# Patient Record
Sex: Male | Born: 1960 | Race: White | Hispanic: No | Marital: Married | Smoking: Never smoker
Health system: Southern US, Community
[De-identification: ages and names within clinical notes are randomized; demographics above are authoritative.]

## PROBLEM LIST (undated history)

## (undated) DIAGNOSIS — Z87442 Personal history of urinary calculi: Secondary | ICD-10-CM

## (undated) DIAGNOSIS — T8859XA Other complications of anesthesia, initial encounter: Secondary | ICD-10-CM

## (undated) DIAGNOSIS — T4145XA Adverse effect of unspecified anesthetic, initial encounter: Secondary | ICD-10-CM

## (undated) DIAGNOSIS — R519 Headache, unspecified: Secondary | ICD-10-CM

## (undated) DIAGNOSIS — R51 Headache: Secondary | ICD-10-CM

## (undated) DIAGNOSIS — C801 Malignant (primary) neoplasm, unspecified: Secondary | ICD-10-CM

## (undated) DIAGNOSIS — I10 Essential (primary) hypertension: Secondary | ICD-10-CM

## (undated) HISTORY — PX: APPENDECTOMY: SHX54

## (undated) HISTORY — PX: CHOLECYSTECTOMY: SHX55

## (undated) HISTORY — PX: SHOULDER ARTHROSCOPY: SHX128

---

## 2004-04-30 DIAGNOSIS — C439 Malignant melanoma of skin, unspecified: Secondary | ICD-10-CM

## 2004-04-30 HISTORY — DX: Malignant melanoma of skin, unspecified: C43.9

## 2010-08-28 DIAGNOSIS — C4491 Basal cell carcinoma of skin, unspecified: Secondary | ICD-10-CM

## 2010-08-28 HISTORY — DX: Basal cell carcinoma of skin, unspecified: C44.91

## 2014-08-05 ENCOUNTER — Ambulatory Visit
Admit: 2014-08-05 | Disposition: A | Discharge status: Home / Self Care | Payer: Self-pay | Attending: Unknown Physician Specialty | Admitting: Unknown Physician Specialty

## 2014-08-23 LAB — SURGICAL PATHOLOGY

## 2018-03-28 ENCOUNTER — Other Ambulatory Visit: Payer: Self-pay

## 2018-03-28 ENCOUNTER — Emergency Department: Payer: No Typology Code available for payment source

## 2018-03-28 ENCOUNTER — Emergency Department
Admission: EM | Admit: 2018-03-28 | Discharge: 2018-03-28 | Disposition: A | Discharge status: Home / Self Care | Payer: No Typology Code available for payment source | Attending: Emergency Medicine | Admitting: Emergency Medicine

## 2018-03-28 DIAGNOSIS — S52042A Displaced fracture of coronoid process of left ulna, initial encounter for closed fracture: Secondary | ICD-10-CM | POA: Diagnosis not present

## 2018-03-28 DIAGNOSIS — S0990XA Unspecified injury of head, initial encounter: Secondary | ICD-10-CM | POA: Diagnosis not present

## 2018-03-28 DIAGNOSIS — S53105A Unspecified dislocation of left ulnohumeral joint, initial encounter: Secondary | ICD-10-CM

## 2018-03-28 DIAGNOSIS — W010XXA Fall on same level from slipping, tripping and stumbling without subsequent striking against object, initial encounter: Secondary | ICD-10-CM | POA: Insufficient documentation

## 2018-03-28 DIAGNOSIS — Y999 Unspecified external cause status: Secondary | ICD-10-CM | POA: Insufficient documentation

## 2018-03-28 DIAGNOSIS — S59902A Unspecified injury of left elbow, initial encounter: Secondary | ICD-10-CM | POA: Diagnosis present

## 2018-03-28 DIAGNOSIS — Y92007 Garden or yard of unspecified non-institutional (private) residence as the place of occurrence of the external cause: Secondary | ICD-10-CM | POA: Insufficient documentation

## 2018-03-28 DIAGNOSIS — Y9301 Activity, walking, marching and hiking: Secondary | ICD-10-CM | POA: Diagnosis not present

## 2018-03-28 DIAGNOSIS — S53005A Unspecified dislocation of left radial head, initial encounter: Secondary | ICD-10-CM | POA: Insufficient documentation

## 2018-03-28 DIAGNOSIS — W19XXXA Unspecified fall, initial encounter: Secondary | ICD-10-CM

## 2018-03-28 MED ORDER — TETANUS-DIPHTH-ACELL PERTUSSIS 5-2.5-18.5 LF-MCG/0.5 IM SUSP: 0.5000 mL | Freq: Once | INTRAMUSCULAR | Status: DC

## 2018-03-28 MED ORDER — OXYCODONE-ACETAMINOPHEN 5-325 MG PO TABS: 1.0000 | ORAL_TABLET | ORAL | 0 refills | Status: DC | PRN

## 2018-03-28 MED ORDER — ONDANSETRON HCL 4 MG/2ML IJ SOLN
INTRAMUSCULAR | Status: AC
  Administered 2018-03-28: 4 mg
  Filled 2018-03-28: qty 2

## 2018-03-28 MED ORDER — FENTANYL CITRATE (PF) 100 MCG/2ML IJ SOLN
INTRAMUSCULAR | Status: AC
  Administered 2018-03-28: 50 ug
  Filled 2018-03-28: qty 2

## 2018-03-28 MED ORDER — LIDOCAINE HCL (PF) 1 % IJ SOLN: 5.0000 mL | Freq: Once | INTRAMUSCULAR | Status: DC

## 2018-03-28 NOTE — ED Triage Notes (Addendum)
Slipped on leaves and fell on packed gravel. Landed on L elbow. Dislocaiton/fracture noted by this RN. Hypotensive and diaphoretic in triage. L hand cool to touch but cap refill less than 3 seconds, pulses present in L wrist.

## 2018-03-28 NOTE — ED Provider Notes (Signed)
[name]  ____________________________________________  Time seen: Approximately 4:03 PM  I have reviewed the triage vital signs and the nursing notes.   HISTORY  Chief Complaint Dislocation   HPI Cory Navarro is a 57 y.o. male no significant past medical history who presents for evaluation of mechanical fall.  Patient reports that he was stepping back in his yard to step on a ladder when he slipped on wet leaves and fell backwards.  He hit the back of his head onto the floor.  He thinks that he had a brief LOC.  Patient fell on top of his left elbow.  Is complaining of severe constant pain since the fall that is located on the left elbow, worse with any movement.  He has an obvious deformity to it.  He denies headache, neck pain, back pain, chest pain, any other extremity pain.   PMH None  Past Surgical History:  Procedure Laterality Date  . APPENDECTOMY    . CHOLECYSTECTOMY    . SHOULDER ARTHROSCOPY      Prior to Admission medications   Medication Sig Start Date End Date Taking? Authorizing Provider  cyclobenzaprine (FLEXERIL) 10 MG tablet Take 10 mg by mouth 3 (three) times daily as needed for muscle spasms.   Yes [provider]  naproxen sodium (ALEVE) 220 MG tablet Take 220 mg by mouth daily as needed.   Yes [provider]  oxyCODONE-acetaminophen (PERCOCET) 5-325 MG tablet Take 1 tablet by mouth every 4 (four) hours as needed for severe pain. 03/28/18   Rudene Re, MD    Allergies Morphine and related  History reviewed. No pertinent family history.  Social History Social History   Tobacco Use  . Smoking status: Never Smoker  Substance Use Topics  . Alcohol use: Yes  . Drug use: Not on file    Review of Systems Constitutional: Negative for fever. Eyes: Negative for visual changes. ENT: Negative for facial injury or neck injury Cardiovascular: Negative for chest  injury. Respiratory: Negative for shortness of breath. Negative for chest wall injury. Gastrointestinal: Negative for abdominal pain or injury. Genitourinary: Negative for dysuria. Musculoskeletal: Negative for back injury, + L elbow deformity Skin: Negative for laceration/abrasions. Neurological: Negative for head injury.   ____________________________________________   PHYSICAL EXAM:  VITAL SIGNS: ED Triage Vitals  Enc Vitals Group     BP 03/28/18 1236 (!) 88/68     Pulse Rate 03/28/18 1236 74     Resp 03/28/18 1236 18     Temp 03/28/18 1236 (!) 97.3 F (36.3 C)     Temp Source 03/28/18 1236 Oral     SpO2 03/28/18 1236 96 %     Weight 03/28/18 1237 212 lb (96.2 kg)     Height 03/28/18 1237 6\' 2"  (1.88 m)     Head Circumference --      Peak Flow --      Pain Score 03/28/18 1237 10     Pain Loc --      Pain Edu? --      Excl. in [place]? --     Constitutional: Alert and oriented. No acute distress. Does not appear intoxicated. HEENT Head: Normocephalic and atraumatic. Face: No facial bony tenderness. Stable midface Ears: No hemotympanum bilaterally. No Battle sign Eyes: No eye injury. PERRL. No raccoon eyes Nose: Nontender. No epistaxis. No rhinorrhea Mouth/Throat: Mucous membranes are moist. No oropharyngeal blood. No dental injury. Airway patent without stridor. Normal voice. Neck: no  C-collar in place. No midline c-spine tenderness.  Cardiovascular: Normal rate, regular rhythm. Normal and symmetric distal pulses are present in all extremities. Pulmonary/Chest: Chest wall is stable and nontender to palpation/compression. Normal respiratory effort. Breath sounds are normal. No crepitus.  Abdominal: Soft, nontender, non distended. Musculoskeletal: Obvious deformity of the left elbow concerning for dislocation with intact neurovascular exam, strong distal pulses and brisk capillary refill.  Nontender with normal full range of motion in all other extremities.No thoracic or  lumbar midline spinal tenderness. Pelvis is stable. Skin: Skin is warm, dry and intact. No abrasions or contutions. Psychiatric: Speech and behavior are appropriate. Neurological: Normal speech and language. Moves all extremities to command. No gross focal neurologic deficits are appreciated.  Glascow Coma Score: 4 - Opens eyes on own 6 - Follows simple motor commands 5 - Alert and oriented GCS: 15  ____________________________________________   LABS (all labs ordered are listed, but only abnormal results are displayed)  Labs Reviewed - No data to display ____________________________________________  EKG  none  ____________________________________________  RADIOLOGY  I have personally reviewed the images performed during this visit and I agree with the Radiologist's read.   Interpretation by Radiologist:  Dg Elbow 2 Views Left  Result Date: 03/28/2018 CLINICAL DATA:  Postreduction of elbow dislocation. EXAM: LEFT ELBOW - 2 VIEW 1:09 p.m. COMPARISON:  03/28/2018 at 12:44 p.m. FINDINGS: The dislocation at the elbow joint has been reduced. The radial head and proximal ulna appear in anatomic position. There is an avulsion fracture of coronoid process of the proximal ulna. Enthesophyte at the distal triceps insertion. Soft tissue calcifications adjacent to the medial epicondyle could be chronic or acute. IMPRESSION: Reduction of the dislocation at the left elbow. Displaced fracture of the coronoid process of the proximal ulna. Electronically Signed   By: [name] M.D.   On: 03/28/2018 13:29   Dg Elbow 2 Views Left  Result Date: 03/28/2018 CLINICAL DATA:  57 year old male with a history of fall and elbow pain EXAM: LEFT ELBOW - 2 VIEW COMPARISON:  None. FINDINGS: Dislocation of the elbow joint. Fracture fragment adjacent to the trochlea on the frontal view likely coronoid process. Associated soft tissue swelling. Degenerative changes. IMPRESSION: Acute fracture dislocation of  the elbow, with the fracture fragment likely representing a displaced coronoid process fracture. Electronically Signed   By: [name] D.O.   On: 03/28/2018 13:00   Ct Head Wo Contrast  Result Date: 03/28/2018 CLINICAL DATA:  Fall with head injury EXAM: CT HEAD WITHOUT CONTRAST CT CERVICAL SPINE WITHOUT CONTRAST TECHNIQUE: Multidetector CT imaging of the head and cervical spine was performed following the standard protocol without intravenous contrast. Multiplanar CT image reconstructions of the cervical spine were also generated. COMPARISON:  None. FINDINGS: CT HEAD FINDINGS Brain: There is no mass effect, midline shift, or acute intracranial hemorrhage. Mild global atrophy. Vascular: No hyperdense vessel or unexpected calcification. Skull: Cranium is intact. Sinuses/Orbits: There is a small depressed fracture of the medial right orbital wall. There is no associated fluid in the ethmoid air cells in this may represent a chronic finding. Paranasal sinuses are otherwise clear. Mastoid air cells are clear. No evidence of orbital or vitreous hemorrhage. Other: Noncontributory. CT CERVICAL SPINE FINDINGS Alignment: Anatomic Skull base and vertebrae: No acute fracture or dislocation. Soft tissues and spinal canal: No obvious spinal hematoma or soft tissue hematoma. No prevertebral edema. There is a calcified mass measuring 1.9 x 1.7 cm posterior to the lower pole of the left lobe of the  thyroid gland. This may represent an exophytic thyroid nodule. Disc levels: No obvious spinal stenosis. Mild scattered degenerative disc disease and degenerative changes in the facet joints. Upper chest: Negative. Other: Noncontributory. IMPRESSION: No acute intracranial pathology.  Atrophy is noted. No evidence of acute cervical spine injury. 1.9 cm partially calcified nonspecific mass posterior to the lower pole of the left lobe of the thyroid gland. This may represent an exophytic thyroid nodule. Thyroid ultrasound is  recommended. If it is separate from the thyroid gland, malignancy is not excluded. Electronically Signed   By: Marybelle Killings M.D.   On: 03/28/2018 13:46   Ct Cervical Spine Wo Contrast  Result Date: 03/28/2018 CLINICAL DATA:  Fall with head injury EXAM: CT HEAD WITHOUT CONTRAST CT CERVICAL SPINE WITHOUT CONTRAST TECHNIQUE: Multidetector CT imaging of the head and cervical spine was performed following the standard protocol without intravenous contrast. Multiplanar CT image reconstructions of the cervical spine were also generated. COMPARISON:  None. FINDINGS: CT HEAD FINDINGS Brain: There is no mass effect, midline shift, or acute intracranial hemorrhage. Mild global atrophy. Vascular: No hyperdense vessel or unexpected calcification. Skull: Cranium is intact. Sinuses/Orbits: There is a small depressed fracture of the medial right orbital wall. There is no associated fluid in the ethmoid air cells in this may represent a chronic finding. Paranasal sinuses are otherwise clear. Mastoid air cells are clear. No evidence of orbital or vitreous hemorrhage. Other: Noncontributory. CT CERVICAL SPINE FINDINGS Alignment: Anatomic Skull base and vertebrae: No acute fracture or dislocation. Soft tissues and spinal canal: No obvious spinal hematoma or soft tissue hematoma. No prevertebral edema. There is a calcified mass measuring 1.9 x 1.7 cm posterior to the lower pole of the left lobe of the thyroid gland. This may represent an exophytic thyroid nodule. Disc levels: No obvious spinal stenosis. Mild scattered degenerative disc disease and degenerative changes in the facet joints. Upper chest: Negative. Other: Noncontributory. IMPRESSION: No acute intracranial pathology.  Atrophy is noted. No evidence of acute cervical spine injury. 1.9 cm partially calcified nonspecific mass posterior to the lower pole of the left lobe of the thyroid gland. This may represent an exophytic thyroid nodule. Thyroid ultrasound is recommended.  If it is separate from the thyroid gland, malignancy is not excluded. Electronically Signed   By: Marybelle Killings M.D.   On: 03/28/2018 13:46   Ct Elbow Left Wo Contrast  Result Date: 03/28/2018 CLINICAL DATA:  Fracture dislocation at the left elbow, now reduced. EXAM: CT OF THE UPPER LEFT EXTREMITY WITHOUT CONTRAST TECHNIQUE: Multidetector CT imaging of the upper left extremity was performed according to the standard protocol. COMPARISON:  Radiographs dated 03/28/2018 FINDINGS: Bones/Joint/Cartilage The dislocations of the radial head and of the proximal ulna had been completely reduced. There is a comminuted fracture of the tip of the coronoid process of the ulna. The fragments are slightly displaced but none are trapped within the joint at this time. Posterior olecranon enthesophyte at the triceps insertion, chronic. Distal humerus is normal. IMPRESSION: 1. Complete reduction of the dislocations of the radial head and proximal ulna. 2. Comminuted fracture of the coronoid process of the ulna with no trapped fragments at this time. Electronically Signed   By: [name] M.D.   On: 03/28/2018 14:40     ____________________________________________   PROCEDURES  Procedure(s) performed: yes Procedures   Reduction of dislocation Date/Time: 4:16 PM Performed by: [place] by: Rudene Re Consent: Verbal consent obtained. Risks and benefits: risks, benefits and alternatives were discussed  Consent given by: patient Required items: required blood products, implants, devices, and special equipment available Time out: Immediately prior to procedure a "time out" was called to verify the correct patient, procedure, equipment, support staff and site/side marked as required.  Patient sedated: no  Vitals: Vital signs were monitored during sedation. Patient tolerance: Patient tolerated the procedure well with no immediate complications. Joint: L elbow Reduction technique:  traction   Critical Care performed:  None ____________________________________________   INITIAL IMPRESSION / ASSESSMENT AND PLAN / ED COURSE  57 y.o. male no significant past medical history who presents for evaluation of mechanical fall.  Patient with a left elbow dislocation which was reduced per procedure note above with no complications.  Patient found to have a proximal ulna fracture which was discussed with Dr. pathology from orthopedics.  He recommended a CT of the elbow which was performed in the emergency room.  After evaluating the CT he recommends follow-up in a tertiary center for surgical repair within the next few days. He also recommended placement of a posterior slab which was done in the emergency room.  Limb was neurovascularly intact pre-and post reduction.  No signs of compartment syndrome.  Pain is extremely well controlled at this time.  Patient also underwent CT head and cervical spine which were unremarkable.  I spoke with Dr. Madie Reno from [facility] who says patient should be seen first thing on Monday for surgical repair as soon as possible.  I discussed that with the patient and I provided him with the phone number and address of [facility].  Discussed signs and symptoms of compartment syndrome with the patient and recommended immediate return to the emergency room if these develop.  At this time patient is stable for discharge home.      As part of my medical decision making, I reviewed the following data within the [place] notes reviewed and incorporated, Labs reviewed , Old chart reviewed, Radiograph reviewed , A consult was requested and obtained from this/these consultant(s) Orthopedics, Notes from prior ED visits and [place] Controlled Substance Database    Pertinent labs & imaging results that were available during my care of the patient were reviewed by me and considered in my medical decision making (see chart for  details).    ____________________________________________   FINAL CLINICAL IMPRESSION(S) / ED DIAGNOSES  Final diagnoses:  Fall, initial encounter  Dislocation of left elbow, initial encounter  Traumatic injury of head, initial encounter  Closed displaced fracture of coronoid process of left ulna, initial encounter      NEW MEDICATIONS STARTED DURING THIS VISIT:  ED Discharge Orders         Ordered    oxyCODONE-acetaminophen (PERCOCET) 5-325 MG tablet  Every 4 hours PRN     03/28/18 1614           Note:  This document was prepared using Dragon voice recognition software and may include unintentional dictation errors.    Rudene Re, MD 03/28/18 [phone]

## 2018-03-28 NOTE — ED Notes (Signed)
Pt and wife are frustrated that the surgeon has not come to see them. They have questions and pt is thirsty. Pt also asking that the fiberglass be covered on the splint to avoid discomfort to his hand. Pt given something to drink and questions answered. Ortho on call is not comfortable with the fx so Dr Alfred Levins is reaching out to [facility].

## 2018-03-28 NOTE — Discharge Instructions (Addendum)
Per recommendation of Dr. Madie Reno of [facility] you should be seen in their clinic on Monday for immediate surgical repair.  Make sure to call the office then for an appointment.  Return to the emergency room if you have worsening pain, tingling or numbness of your arm, changes of color of your fingers as these may be sign of a more severe condition.  Otherwise take pain medication, apply ice, elevate lab, and maintain on splint until cleared by orthopedics.

## 2018-03-30 HISTORY — PX: ELBOW SURGERY: SHX618

## 2018-04-18 ENCOUNTER — Other Ambulatory Visit: Payer: Self-pay | Admitting: Otolaryngology

## 2018-04-18 DIAGNOSIS — E041 Nontoxic single thyroid nodule: Secondary | ICD-10-CM

## 2018-04-22 ENCOUNTER — Ambulatory Visit
Admission: RE | Admit: 2018-04-22 | Discharge: 2018-04-22 | Disposition: A | Discharge status: Home / Self Care | Payer: No Typology Code available for payment source | Source: Ambulatory Visit | Attending: Otolaryngology | Admitting: Otolaryngology

## 2018-04-22 DIAGNOSIS — E041 Nontoxic single thyroid nodule: Secondary | ICD-10-CM

## 2018-04-22 DIAGNOSIS — E042 Nontoxic multinodular goiter: Secondary | ICD-10-CM | POA: Insufficient documentation

## 2018-04-24 ENCOUNTER — Other Ambulatory Visit: Payer: Self-pay | Admitting: Otolaryngology

## 2018-04-24 DIAGNOSIS — E041 Nontoxic single thyroid nodule: Secondary | ICD-10-CM

## 2018-05-01 ENCOUNTER — Ambulatory Visit
Admission: RE | Admit: 2018-05-01 | Discharge: 2018-05-01 | Disposition: A | Discharge status: Home / Self Care | Payer: No Typology Code available for payment source | Source: Ambulatory Visit | Attending: Otolaryngology | Admitting: Otolaryngology

## 2018-05-01 DIAGNOSIS — E041 Nontoxic single thyroid nodule: Secondary | ICD-10-CM | POA: Diagnosis not present

## 2018-05-01 NOTE — Procedures (Signed)
Pre Procedure Dx: Indeterminate left sided thyroid nodule Post Procedural Dx: Same  Technically successful US guided biopsy of indeterminate nodule within the left lobe of the thyroid.   EBL: None  No immediate complications.   Ronny Bacon, MD Pager #: [phone]

## 2018-05-01 NOTE — Discharge Instructions (Signed)
Thyroid Needle Biopsy, Care After  This sheet gives you information about how to care for yourself after your procedure. Your health care provider may also give you more specific instructions. If you have problems or questions, contact your health care provider.  What can I expect after the procedure?  After the procedure, it is common to have:   Soreness and tenderness that lasts for a few days.   Bruising where the needle was inserted (puncture site).  Follow these instructions at home:     Take over-the-counter and prescription medicines only as told by your health care provider.   To help ease discomfort, keep your head raised (elevated) when you are lying down. When you move from lying down to sitting up, use both hands to support the back of your head and neck.   Check your puncture site every day for signs of infection. Check for:  ? Redness, swelling, or pain.  ? Fluid or blood.  ? Warmth.  ? Pus or a bad smell.   Return to your normal activities as told by your health care provider. Ask your health care provider what activities are safe for you.   Keep all follow-up visits as told by your health care provider. This is important.  Contact a health care provider if:   You have redness, swelling, or pain around your puncture site.   You have fluid or blood coming from your puncture site.   Your puncture site feels warm to the touch.   You have pus or a bad smell coming from your puncture site.   You have a fever.  Get help right away if:   You have severe bleeding from the puncture site.   You have difficulty swallowing.   You have swollen glands (lymph nodes) in your neck.  Summary   It is common to have some bruising and soreness where the needle was inserted in your lower front neck area (puncture site).   Check your puncture site every day for signs of infection, such as redness, swelling, or pain.   Get help right away if you have severe bleeding from your puncture site.  This  information is not intended to replace advice given to you by your health care provider. Make sure you discuss any questions you have with your health care provider.  Document Released: 11/11/2013 Document Revised: 01/28/2017 Document Reviewed: 01/28/2017  Elsevier Interactive Patient Education  2019 [employer]

## 2018-05-05 ENCOUNTER — Other Ambulatory Visit: Payer: Self-pay | Admitting: Anatomic Pathology & Clinical Pathology

## 2018-05-05 LAB — CYTOLOGY - NON PAP

## 2018-05-08 ENCOUNTER — Inpatient Hospital Stay: Payer: No Typology Code available for payment source | Attending: Anatomic Pathology & Clinical Pathology

## 2018-05-08 NOTE — Progress Notes (Signed)
Tumor Board Documentation  Rito Lecomte was presented by Dr Pryor Ochoa at our Tumor Board on 05/08/2018, which included representatives from medical oncology, radiation oncology, surgical oncology, internal medicine, navigation, pathology, radiology, surgical, research, palliative care, pulmonology.  Cory Navarro currently presents for discussion, for new positive pathology with history of the following treatments: surgical intervention(s).  Additionally, we reviewed previous medical and familial history, history of present illness, and recent lab results along with all available histopathologic and imaging studies. The tumor board considered available treatment options and made the following recommendations: Surgery Pt scheduled for Total Thyroidectomy 05/15/18  The following procedures/referrals were also placed: No orders of the defined types were placed in this encounter.   Clinical Trial Status: not discussed   Staging used: AJCC Stage Group  National site-specific guidelines NCCN were discussed with respect to the case.  Tumor board is a meeting of clinicians from various specialty areas who evaluate and discuss patients for whom a multidisciplinary approach is being considered. Final determinations in the plan of care are those of the provider(s). The responsibility for follow up of recommendations given during tumor board is that of the provider.   Today's extended care, comprehensive team conference, [place] was not present for the discussion and was not examined.   Multidisciplinary Tumor Board is a multidisciplinary case peer review process.  Decisions discussed in the Multidisciplinary Tumor Board reflect the opinions of the specialists present at the conference without having examined the patient.  Ultimately, treatment and diagnostic decisions rest with the primary provider(s) and the patient.

## 2018-05-12 ENCOUNTER — Other Ambulatory Visit: Payer: Self-pay

## 2018-05-12 ENCOUNTER — Encounter
Admission: RE | Admit: 2018-05-12 | Discharge: 2018-05-12 | Disposition: A | Discharge status: Home / Self Care | Payer: No Typology Code available for payment source | Source: Ambulatory Visit | Attending: Otolaryngology | Admitting: Otolaryngology

## 2018-05-12 HISTORY — DX: Adverse effect of unspecified anesthetic, initial encounter: T41.45XA

## 2018-05-12 HISTORY — DX: Headache: R51

## 2018-05-12 HISTORY — DX: Personal history of urinary calculi: Z87.442

## 2018-05-12 HISTORY — DX: Headache, unspecified: R51.9

## 2018-05-12 HISTORY — DX: Essential (primary) hypertension: I10

## 2018-05-12 HISTORY — DX: Other complications of anesthesia, initial encounter: T88.59XA

## 2018-05-12 HISTORY — DX: Malignant (primary) neoplasm, unspecified: C80.1

## 2018-05-12 NOTE — Pre-Procedure Instructions (Signed)
Progress Notes - documented in this encounter Lendon Colonel, PA - 05/12/2018 10:30 AM EST Formatting of this note might be different from the original. Chief Complaint  Patient presents with  . Blood Pressure Check  elevated for several weeks. recently dx'd w/thyroid cancer.   Cory Navarro is a 58 y.o. male who is here for an acute visit.  He is here for concern of elevated blood pressure that has been present for the past several weeks. He had elbow surgery in December 2019 and since then his blood pressure has been elevated. He would check his readings at home and it would ranges 140s/80-90s on average. He has had intermittently elevated blood pressure over the past several years, but has not been on medication for blood pressure. He has been advised to focus on reducing his cholesterol through diet - he does not eat fried foods. He has been on atorvastatin 10 mg in the past and discontinued this medication because [place] ([place] of the country for the past 18 months). He had elbow surgery in December 2019 and has not been able to exercise regularly. He denies daily headaches, blurred vision, dizziness, lightheadedness, chest pain, or shortness of breath.   He also was recently diagnosed with papillary thyroid carcinoma and is scheduled for a total thyroidectomy on 05/15/2018. He has been under more stress since his diagnoses, in addition to work stress.   Patient Active Problem List  Diagnosis  . Migraine without aura or status migrainosus  . Nephrolithiasis  . Closed nondisplaced fracture of triquetral bone of wrist  . Closed dislocation of left elbow  . Traumatic closed displaced fracture of coronoid process of ulna, left, initial encounter  . Sprain of ulnar collateral ligament of left elbow   Past Medical History:  Diagnosis Date  . Anesthesia complication  seems to take more to "knock me out"  . H/O tension headache  occasional,  in neck and upper back  . History of cancer  melanoma twice, chemo twice  . Migraines  . Nephrolithiasis   Past Surgical History:  Procedure Laterality Date  . APPENDECTOMY 1972  . CHOLECYSTECTOMY 2001  . COLONOSCOPY 08/05/2014  Hyperplastic Polyp: CBF 07/2024  . LEFT SHOULDER SURGERY  . OPEN REDUCTION OLECRANON PROCESS ELBOW Left 04/03/2018  Procedure: OPEN TREATMENT OF ULNAR FRACTURE, PROXIMAL END (EG, OLECRANON OR CORONOID PROCESS[ES]), INCLUDES INTERNAL FIXATION, WHEN PERFORMED; Surgeon: Dorna Bloom Merwyn Katos, MD; Location: [facility] OR; Service: Orthopedics; Laterality: Left;  . REPAIR LATERAL COLLATERAL LIGAMENT ELBOW W/LOCAL TISSUE Left 04/03/2018  Procedure: REPAIR LATERAL COLLATERAL LIGAMENT, ELBOW, WITH LOCAL TISSUE; Surgeon: Darnelle Bos, MD; Location: [facility] OR; Service: Orthopedics; Laterality: Left;   Family History  Problem Relation Age of Onset  . Breast cancer Mother  . Migraines Father  . Cancer Father  Bladder  . Migraines Other  Grandparents, Aunts and Uncles  . Lymphoma Other  family hx   Social History   Socioeconomic History  . Marital status: Married  Spouse name: Not on file  . Number of children: Not on file  . Years of education: Not on file  . Highest education level: Not on file  Occupational History  . Not on file  Social Needs  . Financial resource strain: Not on file  . Food insecurity:  Worry: Not on file  Inability: Not on file  . Transportation needs:  Medical: Not on file  Non-medical: Not on file  Tobacco Use  . Smoking  status: Never Smoker  . Smokeless tobacco: Never Used  Substance and Sexual Activity  . Alcohol use: No  Alcohol/week: 0.0 standard drinks  . Drug use: No  . Sexual activity: Not on file  Lifestyle  . Physical activity:  Days per week: Not on file  Minutes per session: Not on file  . Stress: Not on file  Relationships  . Social connections:  Talks on phone: Not on file  Gets together: Not on  file  Attends religious service: Not on file  Active member of club or organization: Not on file  Attends meetings of clubs or organizations: Not on file  Relationship status: Not on file  Other Topics Concern  . Not on file  Social History Narrative  . Not on file   Current Outpatient Medications Ordered in Epic  Medication Sig Dispense Refill  . acetaminophen (TYLENOL) 325 MG tablet Take by mouth   No current Epic-ordered facility-administered medications on file.   Allergies  Allergen Reactions  . Morphine Other (See Comments)  Seizures  . Vistaril [Hydroxyzine Hcl] Hives   Results for orders placed or performed in visit on 04/25/18  Urinalysis w/Microscopic  Result Value Ref Range  Color Yellow Yellow  Clarity Clear Clear  Specific Gravity 1.020 1.000 - 1.030  pH, Urine 6.0 5.0 - 8.0  Protein, Urinalysis Negative Negative, Trace mg/dL  Glucose, Urinalysis Negative Negative mg/dL  Ketones, Urinalysis Trace (!) Negative mg/dL  Blood, Urinalysis Negative Negative  Nitrite, Urinalysis Negative Negative  Leukocyte Esterase, Urinalysis Negative Negative  White Blood Cells, Urinalysis None Seen None Seen, 0-3 /hpf  Red Blood Cells, Urinalysis None Seen None Seen, 0-3 /hpf  Bacteria, Urinalysis None Seen None Seen /hpf  Squamous Epithelial Cells, Urinalysis None Seen Rare, Few, None Seen /hpf  CBC w/auto Differential (3 Part)  Result Value Ref Range  WBC (White Blood Cell Count) 7.7 4.1 - 10.2 10^3/uL  RBC (Red Blood Cell Count) 5.51 4.69 - 6.13 10^6/uL  Hemoglobin 15.8 14.1 - 18.1 gm/dL  Hematocrit 49.0 40.0 - 52.0 %  MCV (Mean Corpuscular Volume) 88.9 80.0 - 100.0 fl  MCH (Mean Corpuscular Hemoglobin) 28.7 27.0 - 31.2 pg  MCHC (Mean Corpuscular Hemoglobin Concentration) 32.2 32.0 - 36.0 gm/dL  Platelet Count 465 (H) 150 - 450 10^3/uL  RDW-CV (Red Cell Distribution Width) 12.2 11.6 - 14.8 %  MPV (Mean Platelet Volume) 8.7 (L) 9.4 - 12.4 fl  Neutrophils 5.70 1.50 - 7.80  10^3/uL  Lymphocytes 1.20 1.00 - 3.60 10^3/uL  Mixed Count 0.80 0.10 - 0.90 10^3/uL  Neutrophil % 73.7 (H) 32.0 - 70.0 %  Lymphocyte % 15.6 10.0 - 50.0 %  Mixed % 10.7 3.0 - 14.4 %  Comprehensive Metabolic Panel (CMP)  Result Value Ref Range  Glucose 95 70 - 110 mg/dL  Sodium 140 136 - 145 mmol/L  Potassium 4.8 3.6 - 5.1 mmol/L  Chloride 101 97 - 109 mmol/L  Carbon Dioxide (CO2) 31.3 22.0 - 32.0 mmol/L  Urea Nitrogen (BUN) 12 7 - 25 mg/dL  Creatinine 0.8 0.7 - 1.3 mg/dL  Glomerular Filtration Rate (eGFR), MDRD Estimate 100 >60 mL/min/1.73sq m  Calcium 10.4 (H) 8.7 - 10.3 mg/dL  AST 24 8 - 39 U/L  ALT 38 6 - 57 U/L  Alk Phos (alkaline Phosphatase) 64 34 - 104 U/L  Albumin 4.4 3.5 - 4.8 g/dL  Bilirubin, Total 0.6 0.3 - 1.2 mg/dL  Protein, Total 7.2 6.1 - 7.9 g/dL  A/G Ratio 1.6 1.0 - 5.0 gm/dL  BP Readings from Last 3 Encounters:  05/12/18 (!) 160/104  04/25/18 132/86  04/03/18 (!) 144/95   Wt Readings from Last 3 Encounters:  05/12/18 94.8 kg (209 lb)  04/25/18 90.7 kg (200 lb)  04/18/18 99.8 kg (220 lb 0.3 oz)   Body mass index is 27.57 kg/m.  Review of Systems (as otherwise noted in HPI):  Negative for: fever, chills, nausea, or vomiting. Positive for: elevated blood pressure  Physical Exam:  Vitals:  05/12/18 1029  BP: (!) 160/104  BP Location: Right upper arm  Patient Position: Sitting  BP Cuff Size: Adult  Pulse: 74  SpO2: 97%  Weight: 94.8 kg (209 lb)  Height: 185.4 cm (_0 )   General: Patient is well-groomed, well-nourished, appears stated age, in no acute distress.  HEENT: Head - atraumatic and normocephalic. Eyes - conjunctiva are clear bilaterally without injection, PERRL.   CV: Regular rhythm and normal heart rate, no murmur, no JVD.  Respiratory: Clear to auscultation throughout bilateral lung fields with no wheezing, crackles, or rhonchi. No increased work of breathing.  Assessment/Plan:  1. Essential hypertension 2. Papillary thyroid  carcinoma (CMS-HCC) 3. Anxiety We discussed in length contributing factors to hypertension, including pain and stress. He was recently diagnosed with papillary thyroid cancer and is undergoing a thyroidectomy in 3 days. He has had elevated blood pressure readings over the past several months that has been documented. Discussed that we can treat his blood pressure, but his stress and pain would also need to be controlled. He would like to proceed with medication. He is asymptomatic. Will start him on Losartan 25 mg once daily - may need to increase to 50 mg daily, however his blood pressure from 2 weeks ago was only mildly elevated in 130s/80s in the office. He will follow-up in 4 weeks for recheck. - Comprehensive Metabolic Panel (CMP) - CBC w/auto Differential (3 Part) - losartan (COZAAR) 25 MG tablet; Take 1 tablet (25 mg total) by mouth once daily Dispense: 30 tablet; Refill: 1  4. Hyperlipidemia, mixed Will recheck fasting lipid panel today, and if elevated will start on medication.  - Lipid Panel w/calc LDL  Follow-up in 4 weeks for recheck.   Wayland Denis, PA-C  Electronically signed by Lendon Colonel, PA at 05/12/2018 12:21 PM EST   Plan of Treatment - documented as of this encounter Upcoming Encounters Upcoming Encounters  Date Type Specialty Care Team Description  05/20/2018 Post Op Orthopaedics [place], Merwyn Katos, [place]  [address]  [place], [place] [address]  [phone]  [phone] (Fax)    06/10/2018 Office Visit Internal Medicine Bald[facility] Lenis, Jacqulyn Liner, MD  [address]   [facility]  [place], [place] [address]  [phone]  [phone] (Fax)     Pending Results Pending Results  Name Type Priority Associated Diagnoses Date/Time  Comprehensive Metabolic Panel (CMP) Lab Routine Essential hypertension  05/12/2018 10:51 AM EST  Lipid Panel w/calc LDL Lab Routine  Hyperlipidemia, mixed  05/12/2018 10:51 AM EST   Procedures - documented in this encounter Procedure Name Priority Date/Time Associated Diagnosis Comments  CBC W/AUTO DIFFERENTIAL (3 PART DIFF) - [facility] Routine 05/12/2018 10:51 AM EST Essential hypertension  Results for this procedure are in the results section.    Lab Results - documented in this encounter  CBC w/auto Differential (3 Part) (05/12/2018 10:51 AM EST) CBC w/auto Differential (3 Part) (05/12/2018 10:51 AM EST)  Component Value Ref Range Performed  At Pathologist Signature  [facility] Blood Cell Count) 6.3 4.1 - 10.2 10^3/uL [facility]   RBC (Red Blood Cell Count) 5.13 4.69 - 6.13 10^6/uL [facility]   Hemoglobin 14.6 14.1 - 18.1 gm/dL [facility]   Hematocrit 46.0 40.0 - 52.0 % [facility]   MCV (Mean Corpuscular Volume) 89.7 80.0 - 100.0 fl [facility]   MCH (Mean Corpuscular Hemoglobin) 28.5 27.0 - 31.2 pg [facility]   MCHC (Mean Corpuscular Hemoglobin Concentration) 31.7 (L) 32.0 - 36.0 gm/dL [facility]   Platelet Count 317 150 - 450 10^3/uL [facility]   RDW-CV (Red Cell Distribution Width) 12.3 11.6 - 14.8 % [facility]   MPV (Mean Platelet Volume) 8.7 (L) 9.4 - 12.4 fl [facility]   Neutrophils 3.80 1.50 - 7.80 10^3/uL [facility]   Lymphocytes 1.60 1.00 - 3.60 10^3/uL [facility]   Mixed Count 0.90 0.10 - 0.90 10^3/uL [facility]   Neutrophil % 60.1 32.0 - 70.0 % [facility]   Lymphocyte % 25.2 10.0 - 50.0 % [facility]   Mixed % 14.7 (H) 3.0 - 14.4 % [facility]    CBC w/auto Differential (3 Part) (05/12/2018 10:51 AM EST)  Specimen  Blood   CBC w/auto Differential (3 Part) (05/12/2018 10:51 AM EST)  Performing Organization Address  City/State/Zipcode Phone Number  [facility]  [address]  [place], [place] [address]     Visit Diagnoses - documented in this encounter Diagnosis  Essential hypertension - Primary   Papillary thyroid carcinoma (CMS-HCC)   Anxiety  Anxiety state, unspecified   Hyperlipidemia, mixed  Mixed hyperlipidemia    Discontinued Medications - documented as of this encounter Medication Sig Discontinue Reason Start Date End Date  cyclobenzaprine (FLEXERIL) 10 MG tablet  Take by mouth Therapy completed  05/12/2018  MULTIVITAMIN ORAL  Take by mouth once daily. Therapy completed  05/12/2018   Historical Medications - added in this encounter This list may reflect changes made after this encounter.  Medication Sig Dispensed Refills Start Date End Date  acetaminophen (TYLENOL) 325 MG tablet  Take by mouth  0    Images  Patient Contacts  Contact Name Contact Address Communication Relationship to Patient  Alexande Sheerin [address] [place], [place] [phone] [facility]) [phone] (Home) Spouse, Emergency Contact  Document Information  Primary Care Provider Other Service Providers Document Coverage Dates  Barnabas Lister, MD (Jan. 19, 2016January 19, 2016 - Present) DM: [phone] (Work) [phone] (Fax) [address] and Internal Medicine [place], [place] [address] Family Medicine [facility] [address] [place], [place] [address]  Jan. 13, 2020January 13, 2020   [place] [address] [place], [place] [address]   Encounter Providers Encounter Date  Lendon Colonel, [place] (Attending) DM: [phone] (Work) [phone] (Fax) [place],  [address] Family Medicine Jan. 13, 2020January 13, 2020

## 2018-05-12 NOTE — Patient Instructions (Signed)
Your procedure is scheduled on: 05-15-18 THURSDAY Report to Same Day Surgery 2nd floor medical mall [facility] Entrance-take elevator on left to 2nd floor.  Check in with surgery information desk.) To find out your arrival time please call [phone] between 1PM - 3PM on 05-14-18 [facility]  Remember: Instructions that are not followed completely may result in serious medical risk, up to and including death, or upon the discretion of your surgeon and anesthesiologist your surgery may need to be rescheduled.    _x___ 1. Do not eat food after midnight the night before your procedure. NO GUM OR CANDY AFTER MIDNIGHT.  You may drink clear liquids up to 2 hours before you are scheduled to arrive at the hospital for your procedure.  Do not drink clear liquids within 2 hours of your scheduled arrival to the hospital.  Clear liquids include  --Water or Apple juice without pulp  --Clear carbohydrate beverage such as ClearFast or Gatorade  --Black Coffee or Clear Tea (No milk, no creamers, do not add anything to the coffee or Tea   ____Ensure clear carbohydrate drink on the way to the hospital for bariatric patients  ____Ensure clear carbohydrate drink 3 hours before surgery for Dr Dwyane Luo patients if physician instructed.    __x__ 2. No Alcohol for 24 hours before or after surgery.   __x__3. No Smoking or e-cigarettes for 24 prior to surgery.  Do not use any chewable tobacco products for at least 6 hour prior to surgery   ____  4. Bring all medications with you on the day of surgery if instructed.    __x__ 5. Notify your doctor if there is any change in your medical condition     (cold, fever, infections).    x___6. On the morning of surgery brush your teeth with toothpaste and water.  You may rinse your mouth with mouth wash if you wish.  Do not swallow any toothpaste or mouthwash.   Do not wear jewelry, make-up, hairpins, clips or nail polish.  Do not wear lotions, powders, or perfumes.  You may wear deodorant.  Do not shave 48 hours prior to surgery. Men may shave face and neck.  Do not bring valuables to the hospital.    [facility] is not responsible for any belongings or valuables.               Contacts, dentures or bridgework may not be worn into surgery.  Leave your suitcase in the car. After surgery it may be brought to your room.  For patients admitted to the hospital, discharge time is determined by your treatment team.  _  Patients discharged the day of surgery will not be allowed to drive home.  You will need someone to drive you home and stay with you the night of your procedure.    Please read over the following fact sheets that you were given:   [facility] Preparing for Surgery   _x___ TAKE THE FOLLOWING MEDICATION THE MORNING OF SURGERY WITH A SMALL SIP OF WATER. These include:  1. TYLENOL  2. YOU MAY TAKE OXYCODONE DAY OF SURGERY IF NEEDED   3.  4.  5.  6.  ____Fleets enema or Magnesium Citrate as directed.   _x___ Use CHG Soap or sage wipes as directed on instruction sheet   ____ Use inhalers on the day of surgery and bring to hospital day of surgery  ____ Stop Metformin and Janumet 2 days prior to surgery.    ____  Take 1/2 of usual insulin dose the night before surgery and none on the morning surgery.   ____ Follow recommendations from Cardiologist, Pulmonologist or PCP regarding stopping Aspirin, Coumadin, Plavix ,Eliquis, Effient, or Pradaxa, and Pletal.  X____Stop Anti-inflammatories such as Advil, Aleve, Ibuprofen, Motrin, Naproxen, Naprosyn, Goodies powders or aspirin products NOW-OK to take Tylenol    ____ Stop supplements until after surgery.    ____ Bring C-Pap to the hospital.

## 2018-05-13 ENCOUNTER — Encounter
Admission: RE | Admit: 2018-05-13 | Discharge: 2018-05-13 | Disposition: A | Discharge status: Home / Self Care | Payer: No Typology Code available for payment source | Source: Ambulatory Visit | Attending: Otolaryngology | Admitting: Otolaryngology

## 2018-05-13 DIAGNOSIS — D34 Benign neoplasm of thyroid gland: Secondary | ICD-10-CM | POA: Diagnosis not present

## 2018-05-13 DIAGNOSIS — C73 Malignant neoplasm of thyroid gland: Secondary | ICD-10-CM | POA: Diagnosis present

## 2018-05-13 DIAGNOSIS — I10 Essential (primary) hypertension: Secondary | ICD-10-CM

## 2018-05-13 DIAGNOSIS — Z79899 Other long term (current) drug therapy: Secondary | ICD-10-CM | POA: Diagnosis not present

## 2018-05-13 DIAGNOSIS — Z85820 Personal history of malignant melanoma of skin: Secondary | ICD-10-CM | POA: Diagnosis not present

## 2018-05-13 DIAGNOSIS — Z01810 Encounter for preprocedural cardiovascular examination: Secondary | ICD-10-CM

## 2018-05-13 DIAGNOSIS — Z23 Encounter for immunization: Secondary | ICD-10-CM | POA: Diagnosis not present

## 2018-05-14 ENCOUNTER — Encounter: Payer: Self-pay | Admitting: *Deleted

## 2018-05-15 ENCOUNTER — Observation Stay
Admission: RE | Admit: 2018-05-15 | Discharge: 2018-05-16 | Disposition: A | Discharge status: Home / Self Care | Payer: No Typology Code available for payment source | Attending: Otolaryngology | Admitting: Otolaryngology

## 2018-05-15 ENCOUNTER — Other Ambulatory Visit: Payer: Self-pay

## 2018-05-15 ENCOUNTER — Ambulatory Visit: Payer: No Typology Code available for payment source | Admitting: Anesthesiology

## 2018-05-15 ENCOUNTER — Encounter
Admission: RE | Disposition: A | Discharge status: Home / Self Care | Payer: Self-pay | Source: Home / Self Care | Attending: Otolaryngology

## 2018-05-15 DIAGNOSIS — C73 Malignant neoplasm of thyroid gland: Principal | ICD-10-CM | POA: Insufficient documentation

## 2018-05-15 DIAGNOSIS — Z23 Encounter for immunization: Secondary | ICD-10-CM | POA: Insufficient documentation

## 2018-05-15 DIAGNOSIS — Z85820 Personal history of malignant melanoma of skin: Secondary | ICD-10-CM | POA: Insufficient documentation

## 2018-05-15 DIAGNOSIS — D34 Benign neoplasm of thyroid gland: Secondary | ICD-10-CM | POA: Insufficient documentation

## 2018-05-15 DIAGNOSIS — E890 Postprocedural hypothyroidism: Secondary | ICD-10-CM

## 2018-05-15 DIAGNOSIS — Z79899 Other long term (current) drug therapy: Secondary | ICD-10-CM | POA: Insufficient documentation

## 2018-05-15 DIAGNOSIS — I10 Essential (primary) hypertension: Secondary | ICD-10-CM | POA: Insufficient documentation

## 2018-05-15 HISTORY — PX: THYROIDECTOMY: SHX17

## 2018-05-15 LAB — MAGNESIUM: Magnesium: 1.9 mg/dL (ref 1.7–2.4)

## 2018-05-15 LAB — CALCIUM
Calcium: 9 mg/dL (ref 8.9–10.3)
Calcium: 9.5 mg/dL (ref 8.9–10.3)

## 2018-05-15 LAB — ALBUMIN: ALBUMIN: 3.8 g/dL (ref 3.5–5.0)

## 2018-05-15 SURGERY — THYROIDECTOMY
Anesthesia: General

## 2018-05-15 MED ORDER — REMIFENTANIL HCL 1 MG IV SOLR
INTRAVENOUS | Status: AC
  Filled 2018-05-15: qty 1000

## 2018-05-15 MED ORDER — ACETAMINOPHEN 325 MG PO TABS
650.0000 mg | ORAL_TABLET | Freq: Three times a day (TID) | ORAL | Status: DC
  Administered 2018-05-15 – 2018-05-16 (×3): 650 mg via ORAL
  Filled 2018-05-15 (×3): qty 2

## 2018-05-15 MED ORDER — HYDRALAZINE HCL 20 MG/ML IJ SOLN
10.0000 mg | Freq: Once | INTRAMUSCULAR | Status: AC
  Administered 2018-05-15: 10 mg via INTRAVENOUS

## 2018-05-15 MED ORDER — ONDANSETRON HCL 4 MG/2ML IJ SOLN
INTRAMUSCULAR | Status: AC
  Filled 2018-05-15: qty 2

## 2018-05-15 MED ORDER — ONDANSETRON HCL 4 MG/2ML IJ SOLN
INTRAMUSCULAR | Status: DC | PRN
  Administered 2018-05-15: 4 mg via INTRAVENOUS

## 2018-05-15 MED ORDER — DEXAMETHASONE SODIUM PHOSPHATE 10 MG/ML IJ SOLN
INTRAMUSCULAR | Status: AC
  Filled 2018-05-15: qty 1

## 2018-05-15 MED ORDER — DEXAMETHASONE SODIUM PHOSPHATE 10 MG/ML IJ SOLN
INTRAMUSCULAR | Status: DC | PRN
  Administered 2018-05-15: 2 mg via INTRAVENOUS
  Administered 2018-05-15: 8 mg via INTRAVENOUS

## 2018-05-15 MED ORDER — DEXTROSE-NACL 5-0.45 % IV SOLN
INTRAVENOUS | Status: DC
  Administered 2018-05-15: 17:00:00 via INTRAVENOUS

## 2018-05-15 MED ORDER — OXYCODONE-ACETAMINOPHEN 5-325 MG PO TABS
1.0000 | ORAL_TABLET | ORAL | Status: DC | PRN
  Administered 2018-05-15 (×2): 1 via ORAL
  Filled 2018-05-15: qty 1

## 2018-05-15 MED ORDER — CALCIUM CARBONATE 1250 (500 CA) MG PO TABS
1000.0000 mg | ORAL_TABLET | Freq: Three times a day (TID) | ORAL | Status: DC
  Filled 2018-05-15: qty 2

## 2018-05-15 MED ORDER — PROPOFOL 500 MG/50ML IV EMUL
INTRAVENOUS | Status: DC | PRN
  Administered 2018-05-15: 50 ug/kg/min via INTRAVENOUS

## 2018-05-15 MED ORDER — PHENYLEPHRINE HCL 10 MG/ML IJ SOLN
INTRAMUSCULAR | Status: DC | PRN
  Administered 2018-05-15: 100 ug via INTRAVENOUS
  Administered 2018-05-15: 50 ug via INTRAVENOUS
  Administered 2018-05-15 (×6): 100 ug via INTRAVENOUS

## 2018-05-15 MED ORDER — PROPOFOL 10 MG/ML IV BOLUS
INTRAVENOUS | Status: AC
  Filled 2018-05-15: qty 20

## 2018-05-15 MED ORDER — LABETALOL HCL 5 MG/ML IV SOLN
5.0000 mg | INTRAVENOUS | Status: DC | PRN
  Administered 2018-05-15: 5 mg via INTRAVENOUS

## 2018-05-15 MED ORDER — DEXMEDETOMIDINE HCL 200 MCG/2ML IV SOLN
INTRAVENOUS | Status: DC | PRN
  Administered 2018-05-15 (×2): 8 ug via INTRAVENOUS
  Administered 2018-05-15: 4 ug via INTRAVENOUS

## 2018-05-15 MED ORDER — ONDANSETRON HCL 4 MG/2ML IJ SOLN: 4.0000 mg | Freq: Once | INTRAMUSCULAR | Status: DC | PRN

## 2018-05-15 MED ORDER — FENTANYL CITRATE (PF) 100 MCG/2ML IJ SOLN
25.0000 ug | INTRAMUSCULAR | Status: AC | PRN
  Administered 2018-05-15 (×6): 25 ug via INTRAVENOUS

## 2018-05-15 MED ORDER — FENTANYL CITRATE (PF) 100 MCG/2ML IJ SOLN
INTRAMUSCULAR | Status: AC
  Administered 2018-05-15: 25 ug via INTRAVENOUS
  Filled 2018-05-15: qty 2

## 2018-05-15 MED ORDER — SODIUM CHLORIDE FLUSH 0.9 % IV SOLN
INTRAVENOUS | Status: AC
  Filled 2018-05-15: qty 10

## 2018-05-15 MED ORDER — CALCIUM CARBONATE ANTACID 500 MG PO CHEW
5.0000 | CHEWABLE_TABLET | Freq: Three times a day (TID) | ORAL | Status: DC
  Administered 2018-05-15 – 2018-05-16 (×2): 1000 mg via ORAL
  Filled 2018-05-15 (×2): qty 5

## 2018-05-15 MED ORDER — BACITRACIN ZINC 500 UNIT/GM EX OINT
TOPICAL_OINTMENT | CUTANEOUS | Status: AC
  Filled 2018-05-15: qty 28.35

## 2018-05-15 MED ORDER — HYDRALAZINE HCL 20 MG/ML IJ SOLN
INTRAMUSCULAR | Status: AC
  Administered 2018-05-15: 10 mg via INTRAVENOUS
  Filled 2018-05-15: qty 1

## 2018-05-15 MED ORDER — MIDAZOLAM HCL 2 MG/2ML IJ SOLN
INTRAMUSCULAR | Status: AC
  Filled 2018-05-15: qty 2

## 2018-05-15 MED ORDER — ONDANSETRON HCL 4 MG PO TABS: 4.0000 mg | ORAL_TABLET | ORAL | Status: DC | PRN

## 2018-05-15 MED ORDER — LIDOCAINE HCL (CARDIAC) PF 100 MG/5ML IV SOSY
PREFILLED_SYRINGE | INTRAVENOUS | Status: DC | PRN
  Administered 2018-05-15 (×2): 100 mg via INTRAVENOUS

## 2018-05-15 MED ORDER — FAMOTIDINE 20 MG PO TABS
20.0000 mg | ORAL_TABLET | Freq: Once | ORAL | Status: AC
  Administered 2018-05-15: 20 mg via ORAL

## 2018-05-15 MED ORDER — MIDAZOLAM HCL 2 MG/2ML IJ SOLN
INTRAMUSCULAR | Status: DC | PRN
  Administered 2018-05-15: 2 mg via INTRAVENOUS

## 2018-05-15 MED ORDER — EPHEDRINE SULFATE 50 MG/ML IJ SOLN
INTRAMUSCULAR | Status: AC
  Filled 2018-05-15: qty 1

## 2018-05-15 MED ORDER — FENTANYL CITRATE (PF) 100 MCG/2ML IJ SOLN
INTRAMUSCULAR | Status: AC
  Filled 2018-05-15: qty 2

## 2018-05-15 MED ORDER — LIDOCAINE HCL (PF) 2 % IJ SOLN
INTRAMUSCULAR | Status: AC
  Filled 2018-05-15: qty 10

## 2018-05-15 MED ORDER — BUPIVACAINE-EPINEPHRINE (PF) 0.25% -1:200000 IJ SOLN
INTRAMUSCULAR | Status: DC | PRN
  Administered 2018-05-15: 9 mL via PERINEURAL

## 2018-05-15 MED ORDER — OXYCODONE-ACETAMINOPHEN 5-325 MG PO TABS
ORAL_TABLET | ORAL | Status: AC
  Filled 2018-05-15: qty 1

## 2018-05-15 MED ORDER — LABETALOL HCL 5 MG/ML IV SOLN
INTRAVENOUS | Status: AC
  Filled 2018-05-15: qty 4

## 2018-05-15 MED ORDER — PROPOFOL 10 MG/ML IV BOLUS
INTRAVENOUS | Status: DC | PRN
  Administered 2018-05-15: 20 mg via INTRAVENOUS
  Administered 2018-05-15: 180 mg via INTRAVENOUS
  Administered 2018-05-15: 30 mg via INTRAVENOUS

## 2018-05-15 MED ORDER — BUPIVACAINE-EPINEPHRINE (PF) 0.25% -1:200000 IJ SOLN
INTRAMUSCULAR | Status: AC
  Filled 2018-05-15: qty 30

## 2018-05-15 MED ORDER — EPHEDRINE SULFATE 50 MG/ML IJ SOLN
INTRAMUSCULAR | Status: DC | PRN
  Administered 2018-05-15 (×2): 10 mg via INTRAVENOUS
  Administered 2018-05-15: 15 mg via INTRAVENOUS

## 2018-05-15 MED ORDER — LACTATED RINGERS IV SOLN
INTRAVENOUS | Status: DC
  Administered 2018-05-15: 08:00:00 via INTRAVENOUS

## 2018-05-15 MED ORDER — FENTANYL CITRATE (PF) 100 MCG/2ML IJ SOLN
INTRAMUSCULAR | Status: DC | PRN
  Administered 2018-05-15: 50 ug via INTRAVENOUS

## 2018-05-15 MED ORDER — ZOLPIDEM TARTRATE 5 MG PO TABS: 5.0000 mg | ORAL_TABLET | Freq: Every evening | ORAL | Status: DC | PRN

## 2018-05-15 MED ORDER — REMIFENTANIL HCL 2 MG IV SOLR
INTRAVENOUS | Status: DC | PRN
  Administered 2018-05-15: .15 ug/kg/min via INTRAVENOUS

## 2018-05-15 MED ORDER — ACETAMINOPHEN 10 MG/ML IV SOLN
INTRAVENOUS | Status: DC | PRN
  Administered 2018-05-15: 1000 mg via INTRAVENOUS

## 2018-05-15 MED ORDER — PROPOFOL 500 MG/50ML IV EMUL
INTRAVENOUS | Status: AC
  Filled 2018-05-15: qty 50

## 2018-05-15 MED ORDER — SUCCINYLCHOLINE CHLORIDE 20 MG/ML IJ SOLN
INTRAMUSCULAR | Status: DC | PRN
  Administered 2018-05-15: 100 mg via INTRAVENOUS

## 2018-05-15 MED ORDER — ONDANSETRON HCL 4 MG/2ML IJ SOLN: 4.0000 mg | INTRAMUSCULAR | Status: DC | PRN

## 2018-05-15 MED ORDER — INFLUENZA VAC SPLIT QUAD 0.5 ML IM SUSY
0.5000 mL | PREFILLED_SYRINGE | INTRAMUSCULAR | Status: AC
  Administered 2018-05-16: 0.5 mL via INTRAMUSCULAR
  Filled 2018-05-15 (×2): qty 0.5

## 2018-05-15 MED ORDER — LOSARTAN POTASSIUM 25 MG PO TABS
25.0000 mg | ORAL_TABLET | Freq: Every day | ORAL | Status: DC
  Administered 2018-05-15: 25 mg via ORAL
  Filled 2018-05-15: qty 1

## 2018-05-15 MED ORDER — LACTATED RINGERS IV SOLN
INTRAVENOUS | Status: DC | PRN
  Administered 2018-05-15: 09:00:00 via INTRAVENOUS

## 2018-05-15 MED ORDER — ACETAMINOPHEN 10 MG/ML IV SOLN
INTRAVENOUS | Status: AC
  Filled 2018-05-15: qty 100

## 2018-05-15 MED ORDER — SENNOSIDES-DOCUSATE SODIUM 8.6-50 MG PO TABS: 1.0000 | ORAL_TABLET | Freq: Every evening | ORAL | Status: DC | PRN

## 2018-05-15 MED ORDER — FAMOTIDINE 20 MG PO TABS
ORAL_TABLET | ORAL | Status: AC
  Administered 2018-05-15: 20 mg via ORAL
  Filled 2018-05-15: qty 1

## 2018-05-15 SURGICAL SUPPLY — 42 items
BLADE SURG 15 STRL LF DISP TIS (BLADE) ×1 IMPLANT
BLADE SURG 15 STRL SS (BLADE) ×2
CANISTER SUCT 1200ML W/VALVE (MISCELLANEOUS) ×3 IMPLANT
CLOSURE WOUND 1/4X4 (GAUZE/BANDAGES/DRESSINGS)
CNTNR SPEC 2.5X3XGRAD LEK (MISCELLANEOUS) ×1
CONT SPEC 4OZ STER OR WHT (MISCELLANEOUS) ×2
CONTAINER SPEC 2.5X3XGRAD LEK (MISCELLANEOUS) IMPLANT
CORD BIP STRL DISP 12FT (MISCELLANEOUS) ×3 IMPLANT
COVER WAND RF STERILE (DRAPES) ×1 IMPLANT
DERMABOND ADVANCED (GAUZE/BANDAGES/DRESSINGS)
DERMABOND ADVANCED .7 DNX12 (GAUZE/BANDAGES/DRESSINGS) IMPLANT
DRAIN TLS ROUND 10FR (DRAIN) IMPLANT
DRAPE MAG INST 16X20 L/F (DRAPES) ×3 IMPLANT
DRSG TEGADERM 2-3/8X2-3/4 SM (GAUZE/BANDAGES/DRESSINGS) IMPLANT
ELECT LARYNGEAL 6/7 (MISCELLANEOUS)
ELECT LARYNGEAL 8/9 (MISCELLANEOUS) ×3
ELECT REM PT RETURN 9FT ADLT (ELECTROSURGICAL) ×3
ELECTRODE LARYNGEAL 6/7 (MISCELLANEOUS) IMPLANT
ELECTRODE LARYNGEAL 8/9 (MISCELLANEOUS) IMPLANT
ELECTRODE REM PT RTRN 9FT ADLT (ELECTROSURGICAL) ×1 IMPLANT
FORCEPS JEWEL BIP 4-3/4 STR (INSTRUMENTS) ×3 IMPLANT
GAUZE 4X4 16PLY RFD (DISPOSABLE) IMPLANT
GLOVE BIO SURGEON STRL SZ7.5 (GLOVE) ×6 IMPLANT
GOWN STRL REUS W/ TWL LRG LVL3 (GOWN DISPOSABLE) ×3 IMPLANT
GOWN STRL REUS W/TWL LRG LVL3 (GOWN DISPOSABLE) ×6
HEMOSTAT SURGICEL 2X3 (HEMOSTASIS) ×5 IMPLANT
HOOK STAY BLUNT/RETRACTOR 5M (MISCELLANEOUS) ×1 IMPLANT
KIT TURNOVER KIT A (KITS) ×3 IMPLANT
LABEL OR SOLS (LABEL) ×3 IMPLANT
NS IRRIG 500ML POUR BTL (IV SOLUTION) ×3 IMPLANT
PACK HEAD/NECK (MISCELLANEOUS) ×3 IMPLANT
PROBE NEUROSIGN BIPOL (MISCELLANEOUS) ×1 IMPLANT
PROBE NEUROSIGN BIPOLAR (MISCELLANEOUS) ×2
SHEARS HARMONIC 9CM CVD (BLADE) ×3 IMPLANT
SPONGE KITTNER 5P (MISCELLANEOUS) ×5 IMPLANT
STRIP CLOSURE SKIN 1/4X4 (GAUZE/BANDAGES/DRESSINGS) IMPLANT
SUT PROLENE 6 0 P 1 18 (SUTURE) ×3 IMPLANT
SUT SILK 2 0 (SUTURE) ×2
SUT SILK 2 0 SH (SUTURE) ×3 IMPLANT
SUT SILK 2-0 18XBRD TIE 12 (SUTURE) ×1 IMPLANT
SUT VIC AB 4-0 RB1 18 (SUTURE) ×3 IMPLANT
SYSTEM CHEST DRAIN TLS 7FR (DRAIN) IMPLANT

## 2018-05-15 NOTE — Anesthesia Preprocedure Evaluation (Signed)
Anesthesia Evaluation  Patient identified by MRN, date of birth, ID band Patient awake    Reviewed: Allergy & Precautions, NPO status , Patient's Chart, lab work & pertinent test results, reviewed documented beta blocker date and time   Airway Mallampati: II  TM Distance: >3 FB     Dental  (+) Chipped   Pulmonary           Cardiovascular hypertension, Pt. on medications      Neuro/Psych  Headaches,    GI/Hepatic   Endo/Other    Renal/GU      Musculoskeletal   Abdominal   Peds  Hematology   Anesthesia Other Findings   Reproductive/Obstetrics                             Anesthesia Physical Anesthesia Plan  ASA: II  Anesthesia Plan: General   Post-op Pain Management:    Induction: Intravenous  PONV Risk Score and Plan:   Airway Management Planned: Oral ETT  Additional Equipment:   Intra-op Plan:   Post-operative Plan:   Informed Consent: I have reviewed the patients History and Physical, chart, labs and discussed the procedure including the risks, benefits and alternatives for the proposed anesthesia with the patient or authorized representative who has indicated his/her understanding and acceptance.       Plan Discussed with: CRNA  Anesthesia Plan Comments:         Anesthesia Quick Evaluation

## 2018-05-15 NOTE — Progress Notes (Signed)
Dr. Ronelle Nigh notified of BP 133/103 and HR 96 after 5MG  of Labetalol. Acknowledged. Orders received.

## 2018-05-15 NOTE — Anesthesia Procedure Notes (Signed)
Procedure Name: Intubation Date/Time: 05/15/2018 9:01 AM Performed by: Jonna Clark, CRNA Pre-anesthesia Checklist: Patient identified, Patient being monitored, Timeout performed, Emergency Drugs available and Suction available Patient Re-evaluated:Patient Re-evaluated prior to induction Oxygen Delivery Method: Circle system utilized Preoxygenation: Pre-oxygenation with 100% oxygen Induction Type: IV induction Ventilation: Mask ventilation without difficulty Laryngoscope Size: McGraph and 4 Grade View: Grade I Tube type: Oral (neuromonitoring tape applied) Tube size: 7.5 mm Number of attempts: 1 Airway Equipment and Method: Stylet and Video-laryngoscopy Placement Confirmation: ETT inserted through vocal cords under direct vision,  positive ETCO2 and breath sounds checked- equal and bilateral Secured at: 22 cm Tube secured with: Tape Dental Injury: Teeth and Oropharynx as per pre-operative assessment

## 2018-05-15 NOTE — Transfer of Care (Cosign Needed)
Immediate Anesthesia Transfer of Care Note  Patient: Cory Navarro  Procedure(s) Performed: TOTAL THYROIDECTOMY WITH LARYNGEAL NERVE MONITORING (N/A )  Patient Location: PACU  Anesthesia Type:General  Level of Consciousness: awake, alert  and patient cooperative  Airway & Oxygen Therapy: Patient Spontanous Breathing and Patient connected to face mask oxygen  Post-op Assessment: Report given to RN and Post -op Vital signs reviewed and stable  Post vital signs: Reviewed and stable  Last Vitals:  Vitals Value Taken Time  BP 146/106 05/15/2018 11:53 AM  Temp    Pulse 83 05/15/2018 11:54 AM  Resp 14 05/15/2018 11:54 AM  SpO2 99 % 05/15/2018 11:54 AM  Vitals shown include unvalidated device data.  Last Pain:  Vitals:   05/15/18 0726  TempSrc: Tympanic         Complications: No apparent anesthesia complications

## 2018-05-15 NOTE — H&P (Signed)
..  History and Physical paper copy reviewed and updated date of procedure and will be scanned into system.  Patient seen and examined.  

## 2018-05-15 NOTE — Anesthesia Post-op Follow-up Note (Signed)
Anesthesia QCDR form completed.        

## 2018-05-15 NOTE — Progress Notes (Signed)
Dr. Marcello Moores notified of BP 145/104 with HR 106. Acknowledged. Orders received.

## 2018-05-15 NOTE — Op Note (Signed)
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  Large exophytic firm mass adjacent to thyroid in area of parathryoid gland on left inferior aspect.  Recurrent nerve coursed along side of mass 180 degrees and was gently peeled off.  Description of Procedure: After the patient was identified in hold and the history and physical and consent was reviewed and updated. The patient was marked in an upright position on the anterior neck along a natural occuring skin crease. The patient was next taken to the operating room and placed in a supine position. General endotracheal anesthesia was induced with laryngeal monitor endotracheal tube.  Direct visualization by the surgeon of the tube electrodes in contact with the vocal cords was made.. The patient's anterior marked neck crease was neck injected with 9cc's of 0.25% marcaine with 1:200,000 Epinephrine. The patient was next prepped and draped in a sterile normal fashion.  At this time, a 15 blade scalpel was used to make a skin incision along a previously marked anterior neck crease. Dissection was carefully performed through the subcutaneous tissues with combination of Bovie electrocautery and blunt dissection.  The platysma was incised and anterior neck veins ligated with harmonic scalpel.  The median raphe of the strap muscles was  divided in a linear fashion with Bovie electrocautery until the anterior border of the thyroid gland was identified.     Attention at this time was directed to the patient's left side. The sternohyoid muscle was bluntly dissected away from the large left thyroid gland.  The lateral border of the thyroid and the carotid artery was identified.  Dissection bluntly and with Bovie electrocautery was continued superiorly and inferiorly along the lateral edge of the thyroid.  The superior vessels were identified laterally and then medially with blunt dissection in Joel's space between the larynx and the superior thyroid pole.  Further dissection was continued inferiorly as well.  Once the superior pole was pedicled, the superior thyroid vessels were ligated with Harmonic Scalpel.  The  left thyroid was delivered from the woundt.  The large left thyroid nodule once delivered from the wound revealed Berry's ligament and the nodule of Zuckerkandl.  Just beneath this nodule, the recurrent laryngeal nerve was identified and stimulated robustly with movement of the arytenoid joint.  The inferior parathyroid was identified adjacent to the nerve along with the superior parathyroid as well.  The nerve was tracked until its insertion into the larynx.  Next, the remaining attachments of Berry's ligament was divided.    Attention was directed at this time to large exophytic mass just inferior to the thyroid in the patient's mediastinum.  A firm mass was adherent to the trachea and was beneath the sternum into the patient's mediastinum.  This was gently and circumferentially dissected.  The recurrent laryngeal nerve was adherent to the mass along the superior, lateral, and inferior aspects of the circular mass.  The nerve was gently retracted from the mass until it entered the patient's mediastinum.  The remaining attachments  of the mass to the trachea were divided.  At this time a cystic component of the mass drained as it was  peeled off the trachea with bipolar electrocautery.  The mass was sent for frozen section and was consistent with papillary thyroid carcinoma.  Attention at this time was directed to the patient's right side. The sternohyoid muscle was bluntly dissected away from the right hemithyroid.  The lateral border of the thyroid and the carotid artery was identified.  Dissection bluntly and with Bovie electrocautery was continued superiorly and inferiorly along the lateral edge of the thyroid.  The superior vessels were identified laterally and then medially with blunt dissection in Joel's space between the larynx and the superior thyroid pole.  Further dissection was continued inferiorly as well.  Once the superior pole was pedicled, the superior thyroid vessels were ligated with Harmonic Scalpel.  The large right hemithyroid was delivered from the wound after further dissection inferior and superiorly.   Once delivered from the wound revealed Berry's ligament and the nodule of Zuckerkandl.  Just beneath this nodule, the recurrent laryngeal nerve was identified and stimulated robustly with movement of the arytenoid joint.  The inferior parathyroid was identified adjacent to the nerve along with the superior parathyroid as well.  These were identified with good vascularization.  The nerve was tracked until its insertion into the larynx.  Next, the remaining attachments of Berry's ligament was divided and the right hemithyroid gland was completed.  There remaining attachments of the total thyroid were separated from the larynx using bipolar and harmonic scalpel.  The gland was marked on the superior pole of the patient's right thyroid gland with a marking stitch and passed off the table for permanent evaluation.  The wound was copiously irrigated with sterile saline. Meticulous hemostasis with bipolar was obtained.  Visualization of an intact left and right recurrent laryngeal nerves was made and these stimulated  robustly.  The parathyroid glands were intact and well perfused.  Surgicel was placed in the wound bed bilaterally. The wound was then closed in a multilayered fashion with vicryl for subcutaneous tissues and Dermabond for skin closure and topped with a Steri strip.    At this time the patient was extubated and taken to PACU in good condition.  Plan: Admit for observation.  Follow pathology.  Limit activity for 2 weeks. Follow up next week for post-operative evaluation and suture removal.  Follow calcium levels.  Hold on Synthroid for pathology.  Calcium taper.  Cory Navarro  05/15/2018 11:32 AM

## 2018-05-16 ENCOUNTER — Encounter: Payer: Self-pay | Admitting: Otolaryngology

## 2018-05-16 DIAGNOSIS — C73 Malignant neoplasm of thyroid gland: Secondary | ICD-10-CM | POA: Diagnosis not present

## 2018-05-16 MED ORDER — LIOTHYRONINE SODIUM 25 MCG PO TABS: 25.0000 ug | ORAL_TABLET | Freq: Every day | ORAL | 3 refills | Status: AC

## 2018-05-16 MED ORDER — ONDANSETRON HCL 4 MG PO TABS: 4.0000 mg | ORAL_TABLET | ORAL | 0 refills | Status: AC | PRN

## 2018-05-16 MED ORDER — OXYCODONE-ACETAMINOPHEN 5-325 MG PO TABS: 1.0000 | ORAL_TABLET | ORAL | 0 refills | Status: AC | PRN

## 2018-05-16 MED ORDER — CALCIUM CARBONATE ANTACID 500 MG PO CHEW: 5.0000 | CHEWABLE_TABLET | Freq: Three times a day (TID) | ORAL | 0 refills | Status: AC

## 2018-05-16 NOTE — Progress Notes (Signed)
Discharge instructions reviewed with patient including followup visits and new medications.  Understanding was verbalized and all questions were answered.  IV removed without complication; patient tolerated well.  Patient discharged home ambulatory in stable condition. 

## 2018-05-16 NOTE — Final Progress Note (Signed)
..   05/16/2018 8:20 AM  Franchot Erichsen [id number]  Post-Op Day 1    Temp:  [97 F (36.1 C)-98.4 F (36.9 C)] 97.7 F (36.5 C) (01/17 0545) Pulse Rate:  [83-112] 85 (01/17 0545) Resp:  [12-22] 20 (01/17 0545) BP: (126-156)/(83-111) 126/87 (01/17 0545) SpO2:  [94 %-100 %] 97 % (01/17 0545),     Intake/Output Summary (Last 24 hours) at 05/16/2018 0820 Last data filed at 05/15/2018 1736 Gross per 24 hour  Intake 1136.6 ml  Output 1675 ml  Net -538.4 ml    Results for orders placed or performed during the hospital encounter of 05/15/18 (from the past 24 hour(s))  Albumin     Status: None   Collection Time: 05/15/18 12:00 PM  Result Value Ref Range   Albumin 3.8 3.5 - 5.0 g/dL  Calcium     Status: None   Collection Time: 05/15/18 12:00 PM  Result Value Ref Range   Calcium 9.0 8.9 - 10.3 mg/dL  Magnesium     Status: None   Collection Time: 05/15/18 12:00 PM  Result Value Ref Range   Magnesium 1.9 1.7 - 2.4 mg/dL  Calcium     Status: None   Collection Time: 05/15/18  6:32 PM  Result Value Ref Range   Calcium 9.5 8.9 - 10.3 mg/dL    SUBJECTIVE:  No acute events.  Required pain medication X1.  Ambulating.  Tolerating diet that was advanced.  OBJECTIVE:  GEN-  NAD NECK-  Incision c/d/i with expected bruising next to left side  IMPRESSION:  S/p total thyroidectomy and excision of neck mass  PLAN:  OK to discharge home on oral calcium taper.  Will also begin Cytomel 80mcg qday.  Follow up in 1 weeks.  Jenelle Drennon 05/16/2018, 8:20 AM

## 2018-05-16 NOTE — Anesthesia Postprocedure Evaluation (Signed)
Anesthesia Post Note  Patient: ANCIL DEWAN  Procedure(s) Performed: TOTAL THYROIDECTOMY WITH LARYNGEAL NERVE MONITORING (N/A )  Patient location during evaluation: PACU Anesthesia Type: General Level of consciousness: awake and alert Pain management: pain level controlled Vital Signs Assessment: post-procedure vital signs reviewed and stable Respiratory status: spontaneous breathing, nonlabored ventilation, respiratory function stable and patient connected to nasal cannula oxygen Cardiovascular status: blood pressure returned to baseline and stable Postop Assessment: no apparent nausea or vomiting Anesthetic complications: no     Last Vitals:  Vitals:   05/15/18 2122 05/16/18 0545  BP: (!) 133/93 126/87  Pulse: (!) 112 85  Resp: 20 20  Temp: 36.8 C 36.5 C  SpO2: 96% 97%    Last Pain:  Vitals:   05/16/18 0545  TempSrc: Oral  PainSc:                  Jalicia Roszak S

## 2018-05-21 LAB — SURGICAL PATHOLOGY

## 2018-06-06 ENCOUNTER — Other Ambulatory Visit: Payer: Self-pay | Admitting: "Endocrinology

## 2018-06-06 DIAGNOSIS — C73 Malignant neoplasm of thyroid gland: Secondary | ICD-10-CM

## 2018-06-27 ENCOUNTER — Ambulatory Visit
Admission: RE | Admit: 2018-06-27 | Discharge: 2018-06-27 | Disposition: A | Discharge status: Home / Self Care | Payer: No Typology Code available for payment source | Source: Ambulatory Visit | Attending: "Endocrinology | Admitting: "Endocrinology

## 2018-06-27 DIAGNOSIS — C73 Malignant neoplasm of thyroid gland: Secondary | ICD-10-CM | POA: Diagnosis present

## 2018-06-27 MED ORDER — SODIUM IODIDE I 131 CAPSULE
100.0000 | Freq: Once | INTRAVENOUS | Status: AC | PRN
  Administered 2018-06-27: 96.46 via ORAL

## 2018-07-04 ENCOUNTER — Encounter
Admission: RE | Admit: 2018-07-04 | Discharge: 2018-07-04 | Disposition: A | Discharge status: Home / Self Care | Payer: No Typology Code available for payment source | Source: Ambulatory Visit | Attending: "Endocrinology | Admitting: "Endocrinology

## 2018-07-04 DIAGNOSIS — C73 Malignant neoplasm of thyroid gland: Secondary | ICD-10-CM | POA: Insufficient documentation

## 2019-07-24 ENCOUNTER — Ambulatory Visit: Payer: No Typology Code available for payment source

## 2019-08-03 ENCOUNTER — Other Ambulatory Visit: Payer: Self-pay

## 2019-08-03 ENCOUNTER — Ambulatory Visit: Payer: No Typology Code available for payment source | Attending: Internal Medicine

## 2019-08-03 DIAGNOSIS — Z23 Encounter for immunization: Secondary | ICD-10-CM

## 2019-08-03 NOTE — Progress Notes (Signed)
   Covid-19 Vaccination Clinic  Name:  Cory Navarro    MRN: [id number] DOB: Sep 18, 1960  08/03/2019  [name] was observed post Covid-19 immunization for 15 minutes without incident. He was provided with Vaccine Information Sheet and instruction to access the V-Safe system.   [name] was instructed to call 911 with any severe reactions post vaccine: Marland Kitchen Difficulty breathing  . Swelling of face and throat  . A fast heartbeat  . A bad rash all over body  . Dizziness and weakness   Immunizations Administered    Name Date Dose VIS Date Route   Pfizer COVID-19 Vaccine 08/03/2019 10:34 AM 0.3 mL 04/10/2019 Intramuscular   Manufacturer: [place]   Lot: [id number]   [place]: [id number]

## 2019-08-26 ENCOUNTER — Other Ambulatory Visit: Payer: Self-pay

## 2019-08-26 ENCOUNTER — Ambulatory Visit: Payer: No Typology Code available for payment source | Attending: Internal Medicine

## 2019-08-26 DIAGNOSIS — Z23 Encounter for immunization: Secondary | ICD-10-CM

## 2019-08-26 NOTE — Progress Notes (Signed)
   Covid-19 Vaccination Clinic  Name:  Cory Navarro    MRN: [id number] DOB: 02/17/1961  08/26/2019  Cory Navarro was observed post Covid-19 immunization for 15 minutes without incident. He was provided with Vaccine Information Sheet and instruction to access the V-Safe system.   Cory Navarro was instructed to call 911 with any severe reactions post vaccine: Marland Kitchen Difficulty breathing  . Swelling of face and throat  . A fast heartbeat  . A bad rash all over body  . Dizziness and weakness   Immunizations Administered    Name Date Dose VIS Date Route   Pfizer COVID-19 Vaccine 08/26/2019  1:03 PM 0.3 mL 06/24/2018 Intramuscular   Manufacturer: [place]   Lot: [id number]   [place]: [id number]

## 2020-05-29 IMAGING — CT CT CERVICAL SPINE W/O CM
4 of 7 series · 13 of 33 positions shown, 14 images · non-contrast
Comparison: None.

CLINICAL DATA: Fall with head injury

EXAM:
CT HEAD WITHOUT CONTRAST
CT CERVICAL SPINE WITHOUT CONTRAST
TECHNIQUE: Multidetector CT imaging of the head and cervical spine was
performed following the standard protocol without intravenous
contrast. Multiplanar CT image reconstructions of the cervical spine
were also generated.

[Series 7: c spine soft · axial · 0.32mm/px · z∈[-286,-178]mm · 4 of 90 slices shown]
[im 18/90  soft-tissue]
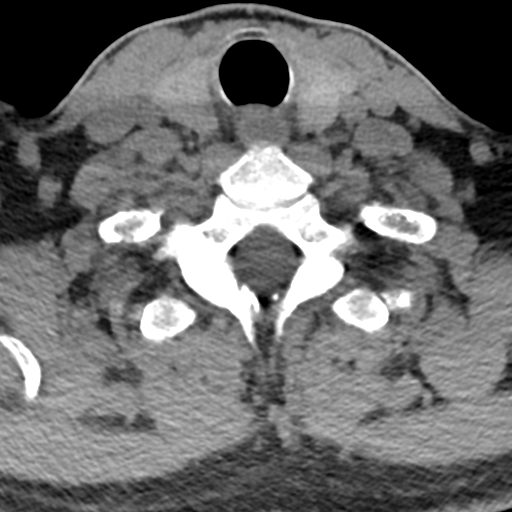
[im 36/90  soft-tissue]
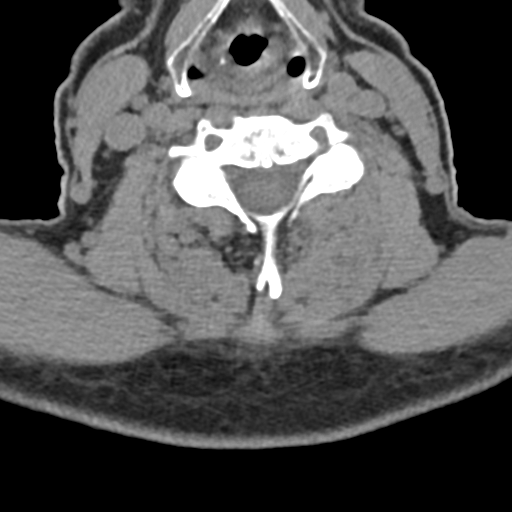
[im 54/90  soft-tissue]
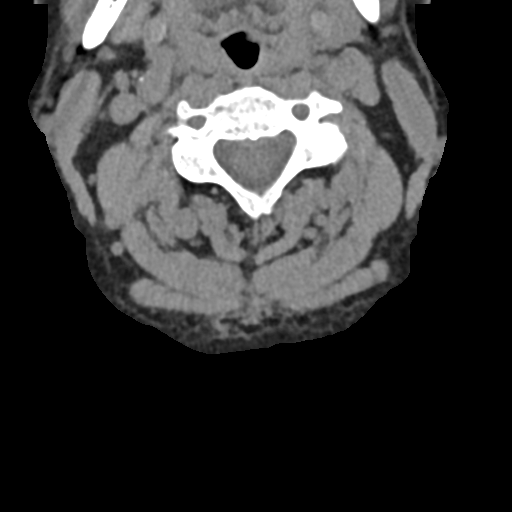
[im 72/90  soft-tissue]
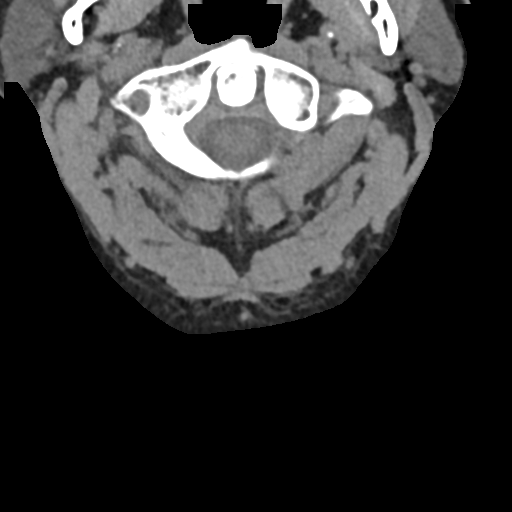

[Series 8: sagittal bone · sagittal · 0.27mm/px · 4 of 42 slices shown]
[im 9/42  bone]
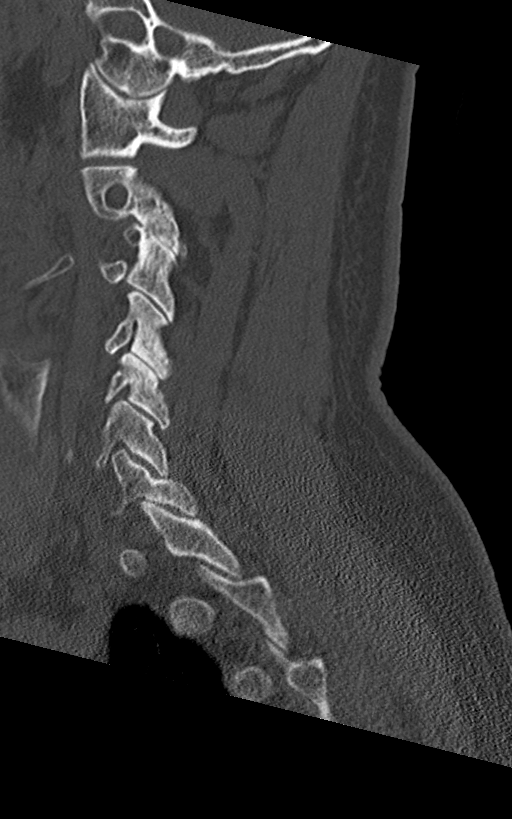
[im 17/42  bone]
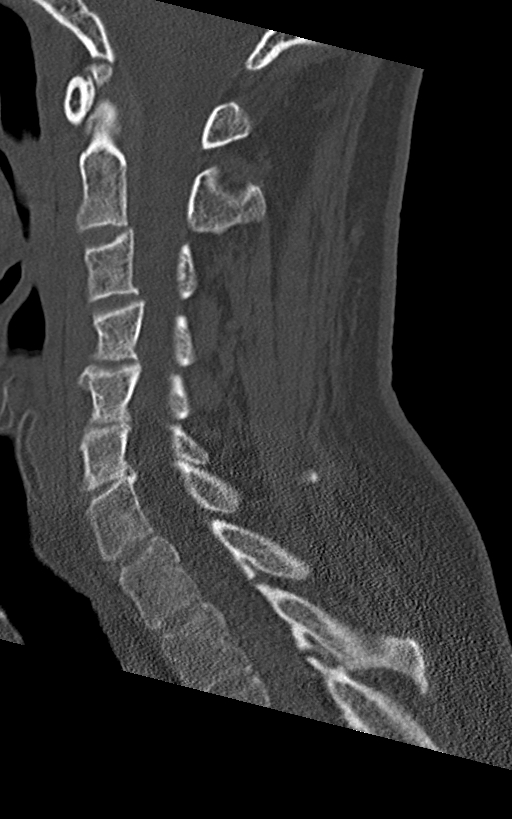
[im 25/42  bone]
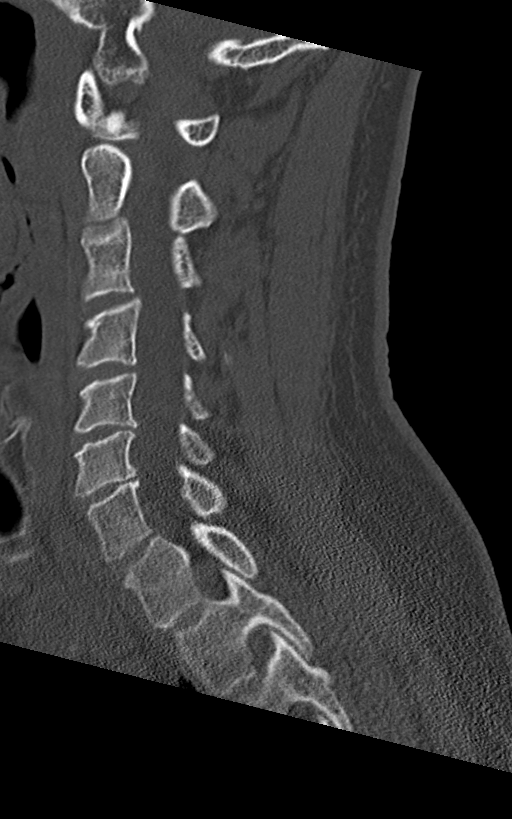
[im 33/42  bone]
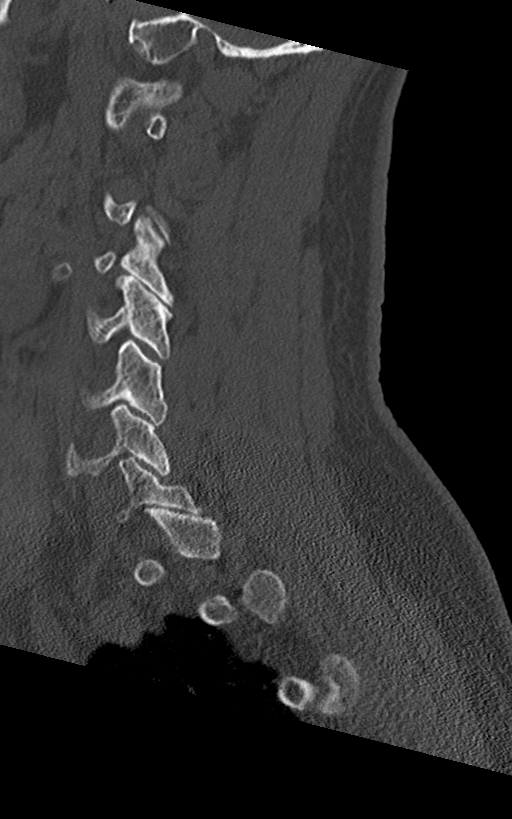

[Series 9: coronal bone · coronal · 0.21mm/px · 1 of 48 slices shown]
[im 24/48  bone]
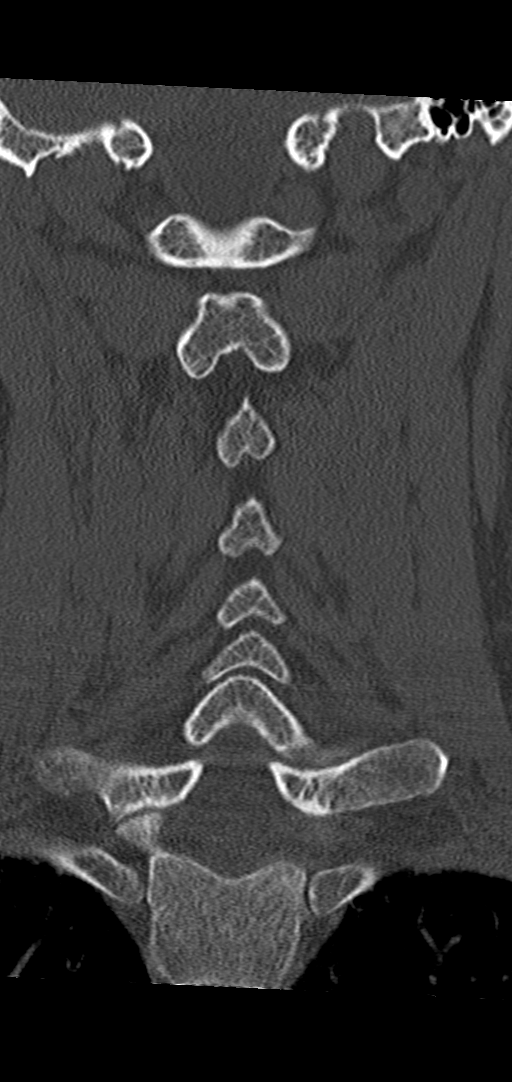

[Series 10: orthogonal bone · axial · 0.21mm/px · z∈[-301,-190]mm · 4 of 95 slices shown, 5 images]
[im 19/95  soft-tissue]
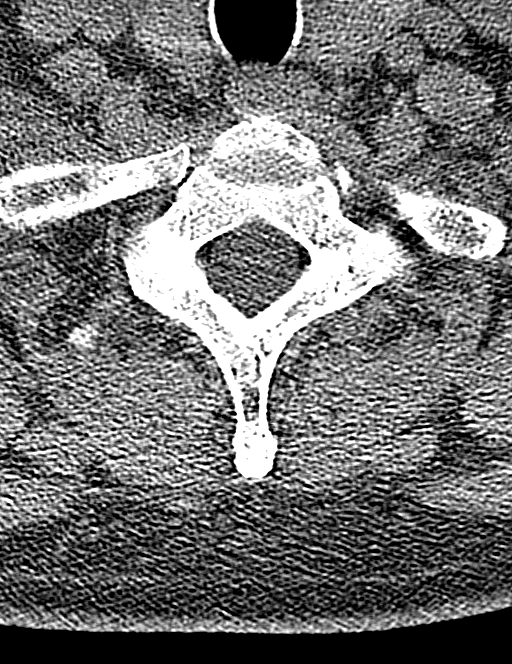
[im 19/95  bone]
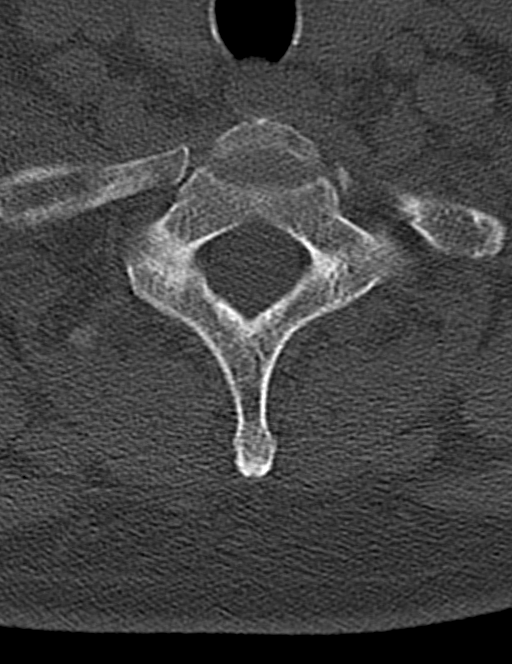
[im 38/95  bone]
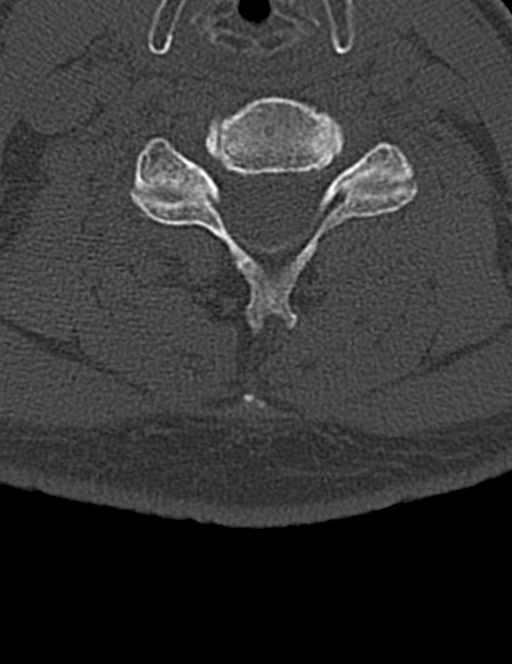
[im 57/95  bone]
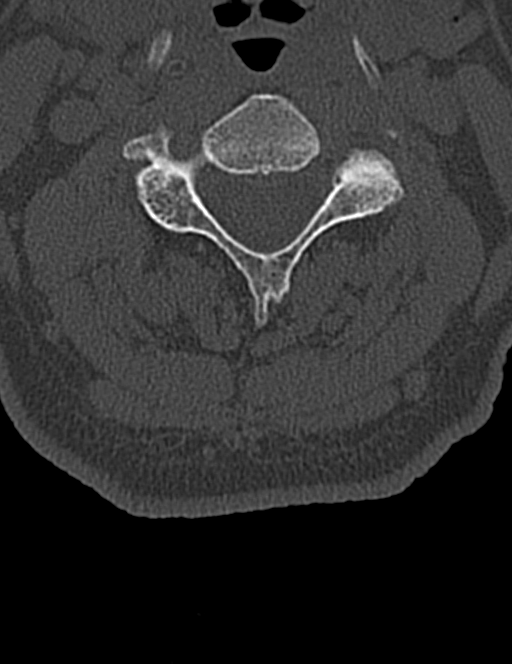
[im 76/95  bone]
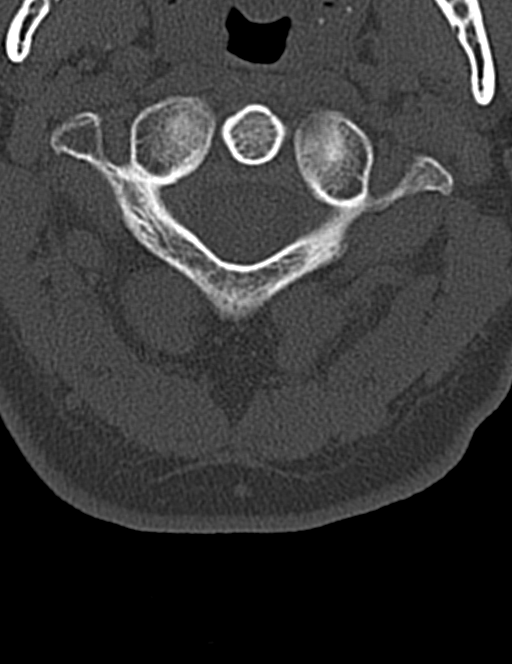

[13 of 33 positions shown; findings below may reference images not displayed]

FINDINGS: CT HEAD FINDINGS

Brain: There is no mass effect, midline shift, or acute intracranial
hemorrhage. Mild global atrophy.

Vascular: No hyperdense vessel or unexpected calcification.

Skull: Cranium is intact.

Sinuses/Orbits: There is a small depressed fracture of the medial
right orbital wall. There is no associated fluid in the ethmoid air
cells in this may represent a chronic finding. Paranasal sinuses are
otherwise clear. Mastoid air cells are clear. No evidence of orbital
or vitreous hemorrhage.

Other: Noncontributory.

CT CERVICAL SPINE FINDINGS

Alignment: Anatomic

Skull base and vertebrae: No acute fracture or dislocation.

Soft tissues and spinal canal: No obvious spinal hematoma or soft
tissue hematoma. No prevertebral edema. There is a calcified mass
measuring 1.9 x 1.7 cm posterior to the lower pole of the left lobe
of the thyroid gland. This may represent an exophytic thyroid
nodule.

Disc levels: No obvious spinal stenosis. Mild scattered degenerative
disc disease and degenerative changes in the facet joints.

Upper chest: Negative.

Other: Noncontributory.
IMPRESSION: No acute intracranial pathology.  Atrophy is noted.

No evidence of acute cervical spine injury.

1.9 cm partially calcified nonspecific mass posterior to the lower
pole of the left lobe of the thyroid gland. This may represent an
exophytic thyroid nodule. Thyroid ultrasound is recommended. If it
is separate from the thyroid gland, malignancy is not excluded.

## 2020-05-29 IMAGING — CT CT ELBOW*L* W/O CM
3 of 5 series · 12 of 33 positions shown, 14 images · non-contrast
Comparison: Radiographs dated 03/28/2018

CLINICAL DATA: Fracture dislocation at the left elbow, now reduced.

EXAM:
CT OF THE UPPER LEFT EXTREMITY WITHOUT CONTRAST
TECHNIQUE: Multidetector CT imaging of the upper left extremity was performed
according to the standard protocol.

[Series 5: axial bone · axial · 0.30mm/px · z∈[-183,-43]mm · 4 of 236 slices shown, 5 images]
[im 40/236  soft-tissue]
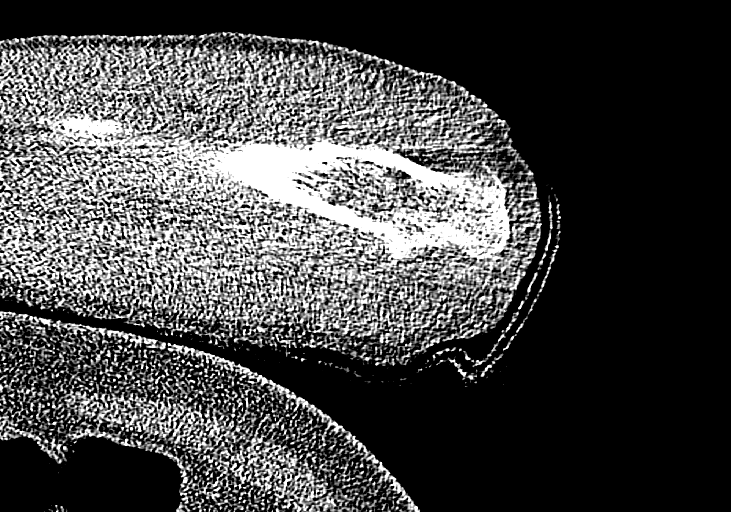
[im 40/236  bone]
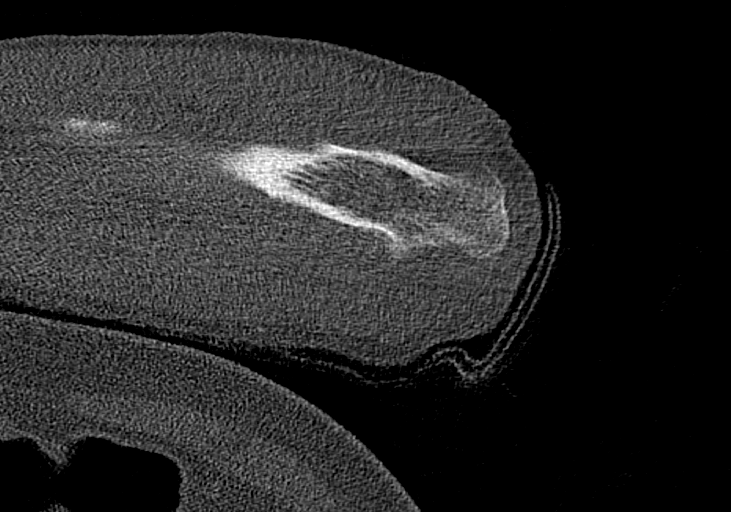
[im 79/236  bone]
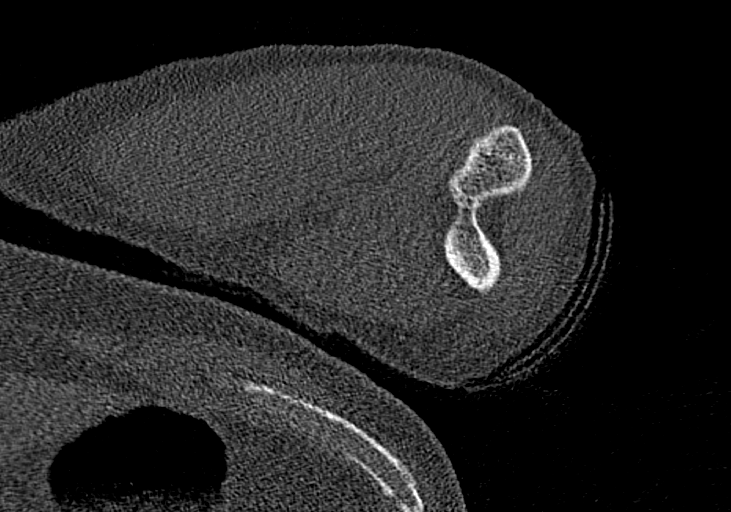
[im 157/236  bone]
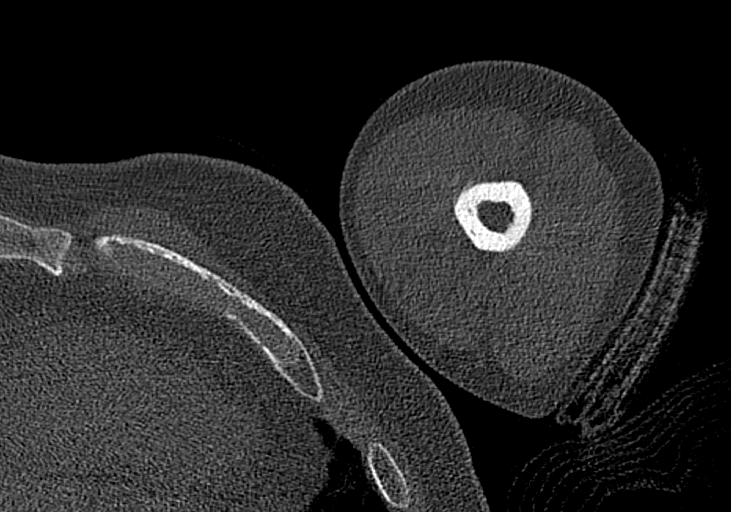
[im 196/236  bone]
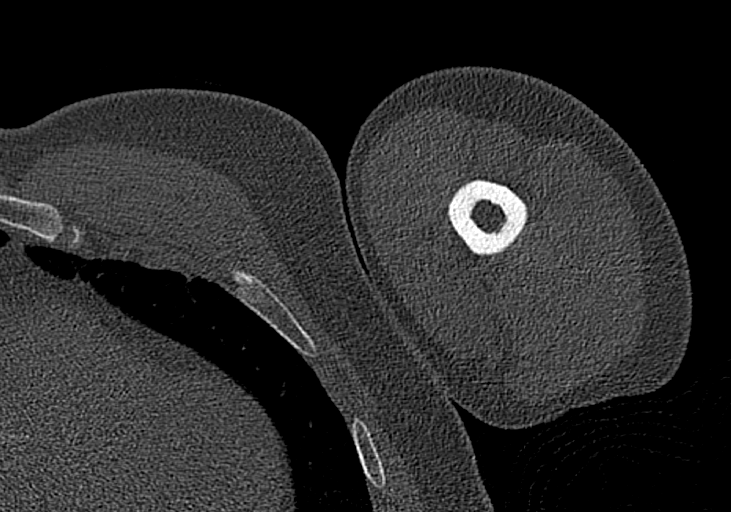

[Series 7: sag bone · coronal · 0.43mm/px · 3 of 144 slices shown]
[im 18/144  bone]
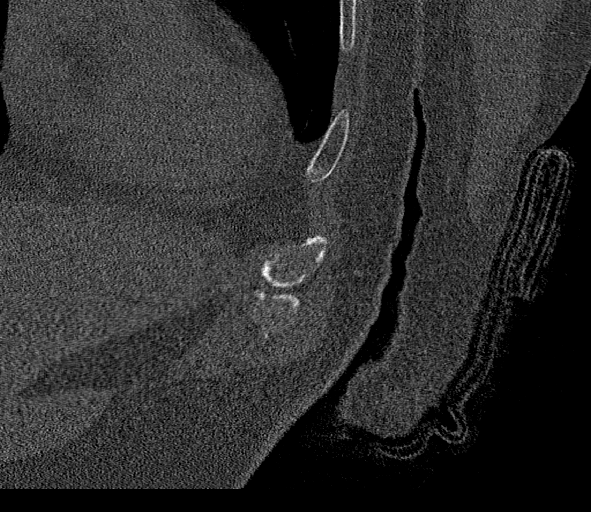
[im 72/144  bone]
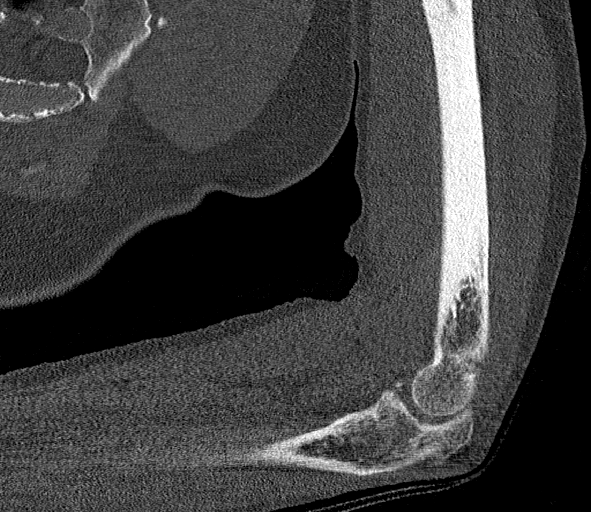
[im 127/144  bone]
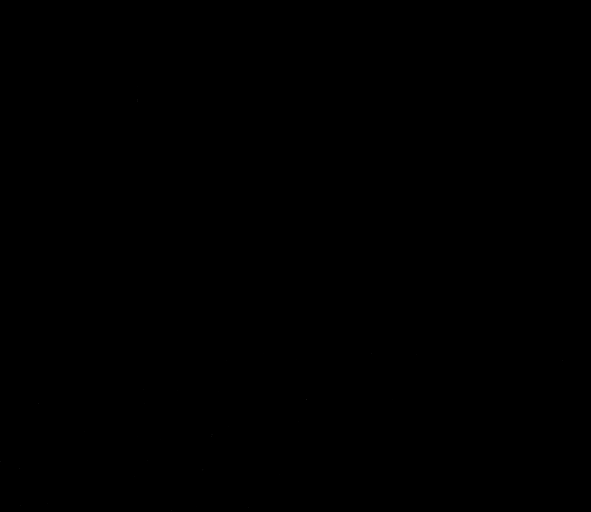

[Series 10: cor st · sagittal · 0.28mm/px · 5 of 166 slices shown, 6 images]
[im 56/166  bone]
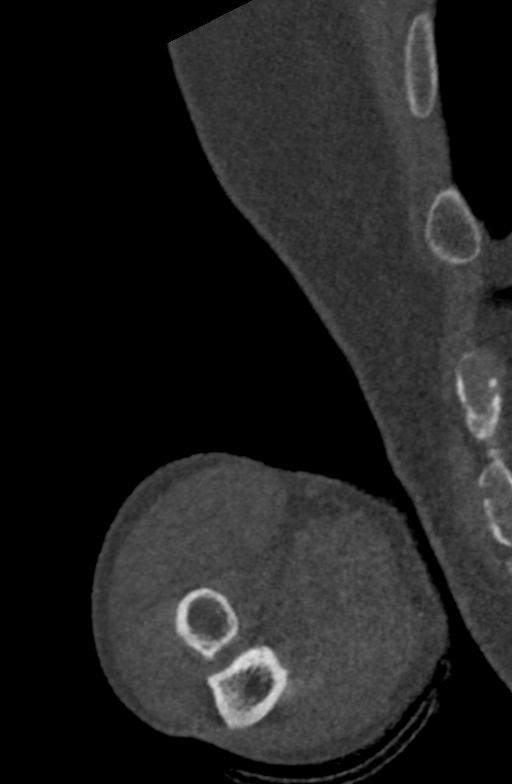
[im 69/166  bone]
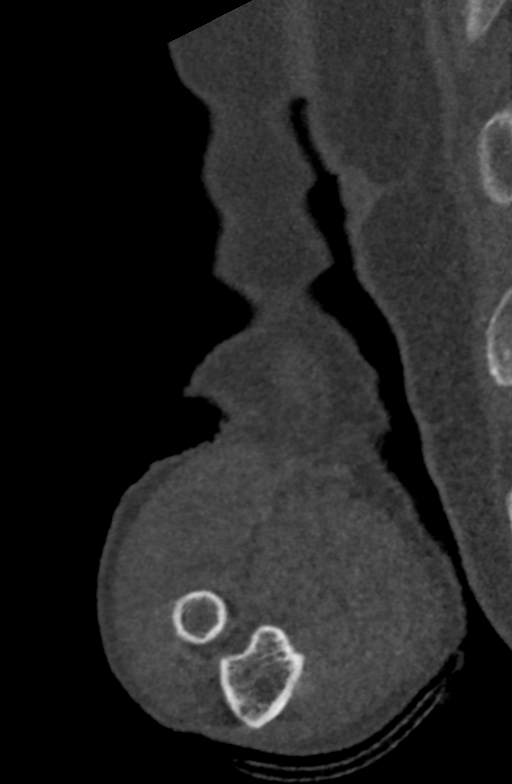
[im 83/166  soft-tissue]
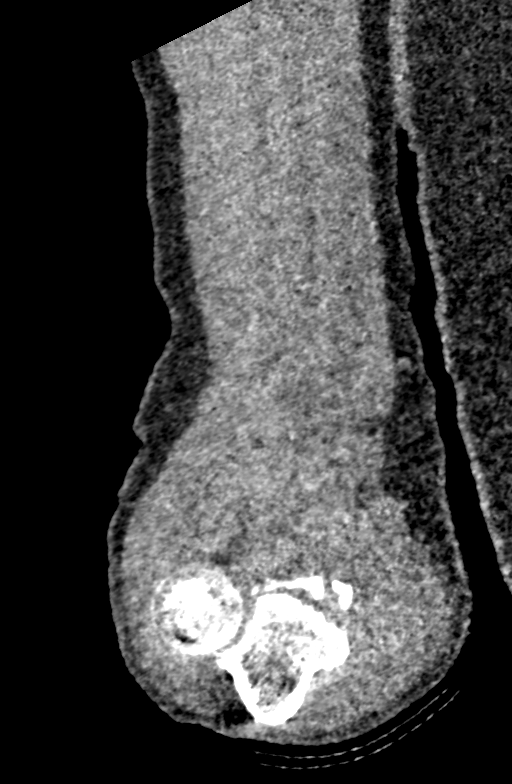
[im 83/166  bone]
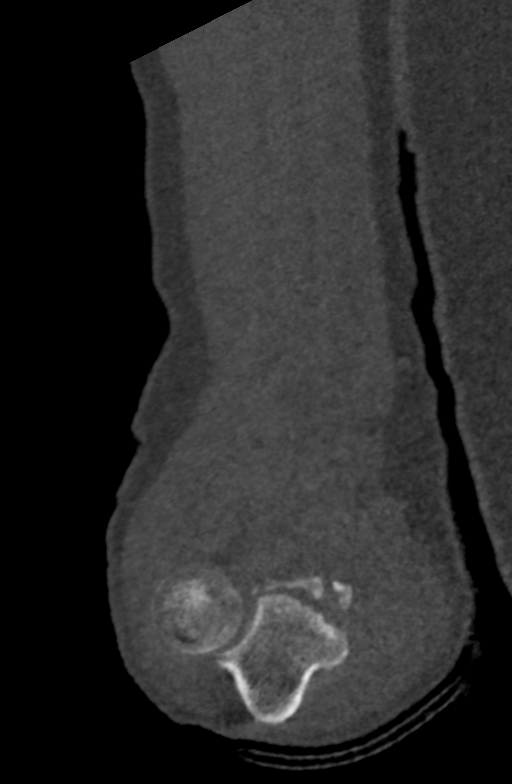
[im 97/166  bone]
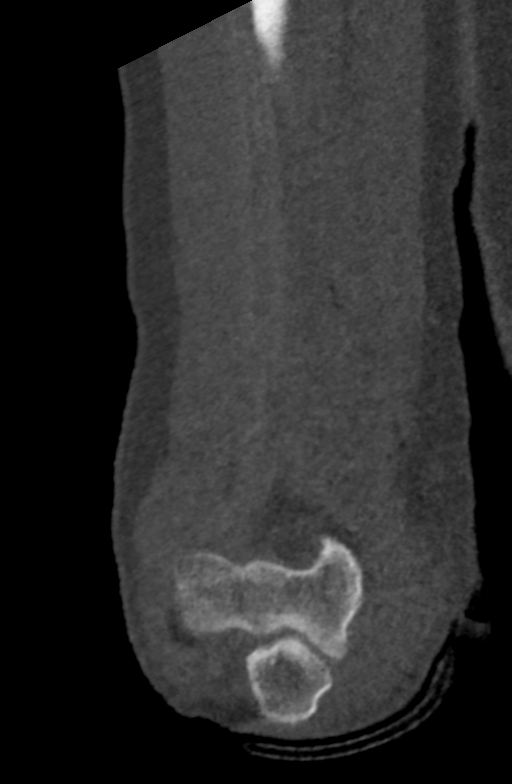
[im 111/166  bone]
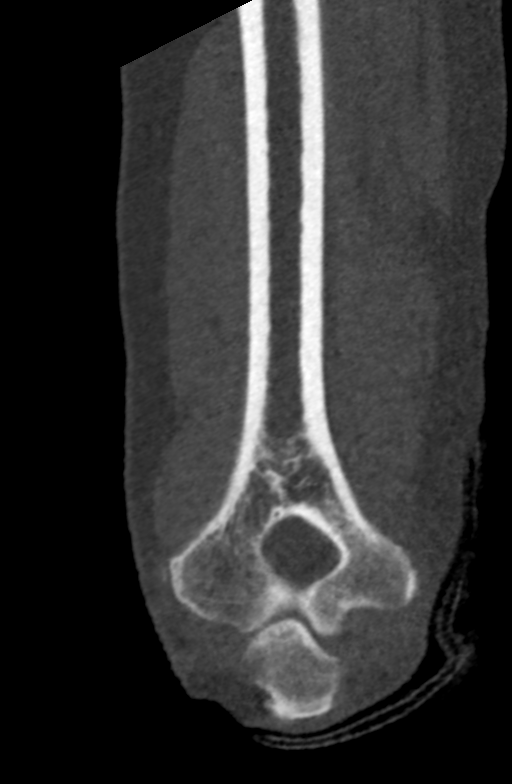

[12 of 33 positions shown; findings below may reference images not displayed]

FINDINGS: Bones/Joint/Cartilage

The dislocations of the radial head and of the proximal ulna had
been completely reduced. There is a comminuted fracture of the tip
of the coronoid process of the ulna. The fragments are slightly
displaced but none are trapped within the joint at this time.

Posterior olecranon enthesophyte at the triceps insertion, chronic.

Distal humerus is normal.
IMPRESSION: 1. Complete reduction of the dislocations of the radial head and
proximal ulna.
2. Comminuted fracture of the coronoid process of the ulna with no
trapped fragments at this time.

## 2020-05-29 IMAGING — DX DG ELBOW 2V*L*
3 series · 3 of 3 positions shown · non-contrast
Comparison: 03/28/2018 at [DATE] p.m.

CLINICAL DATA: Postreduction of elbow dislocation.

EXAM:
LEFT ELBOW - 2 VIEW [DATE] p.m.

[elbow lat]
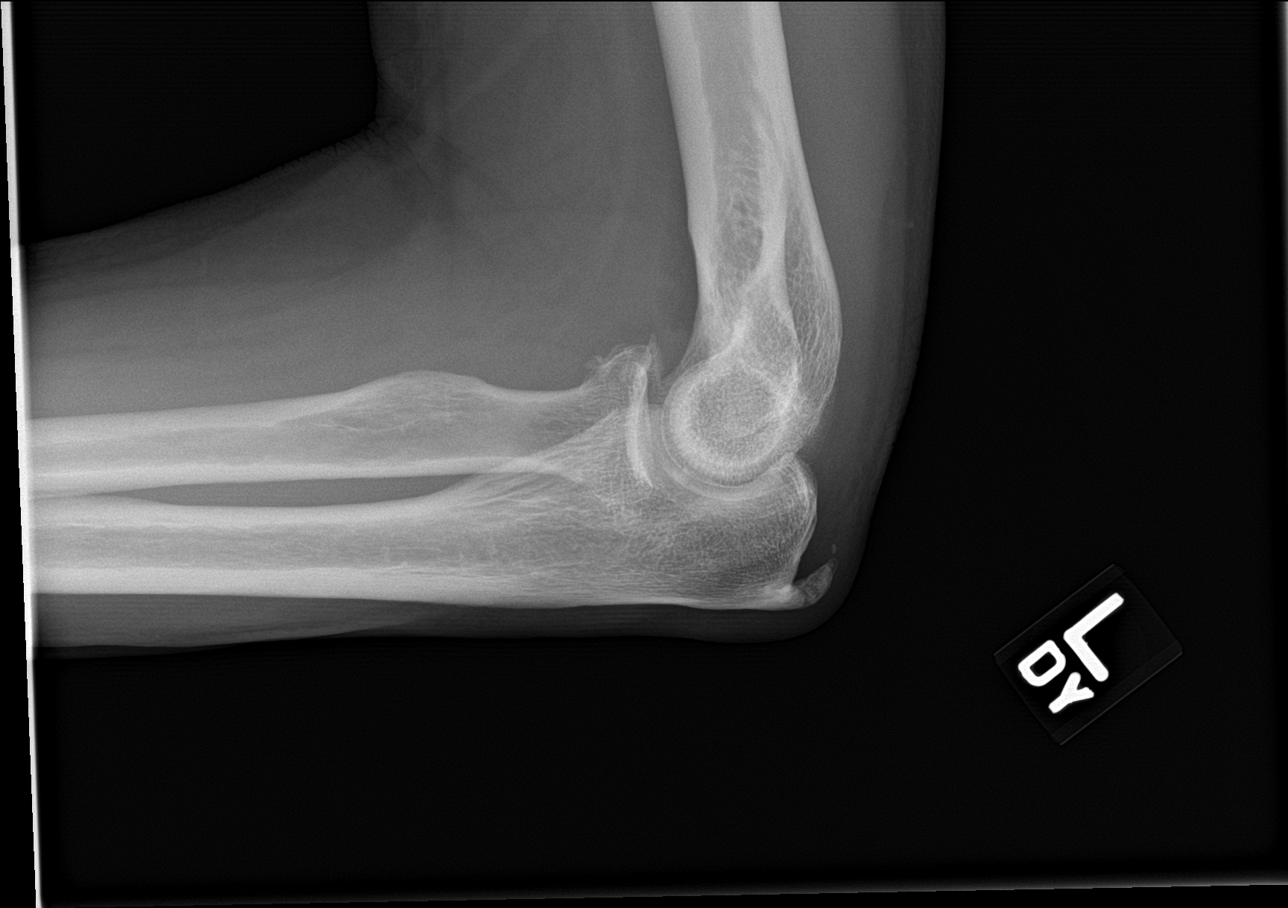

[elbow ap (1 of 2)]
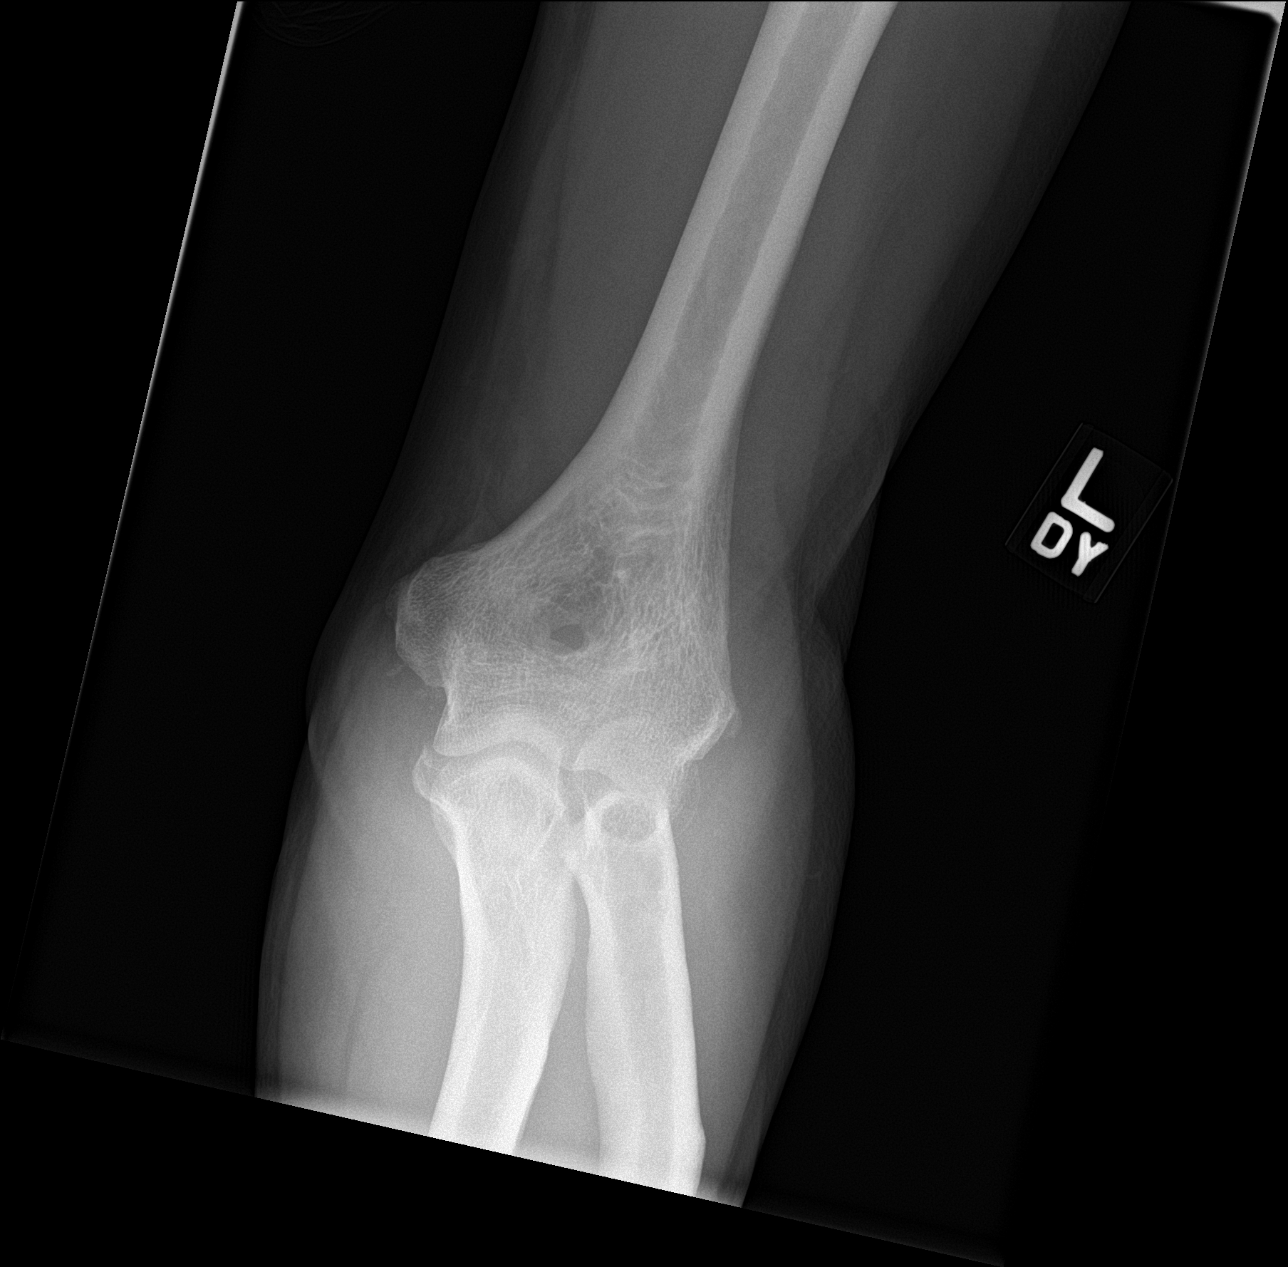

[elbow ap (2 of 2)]
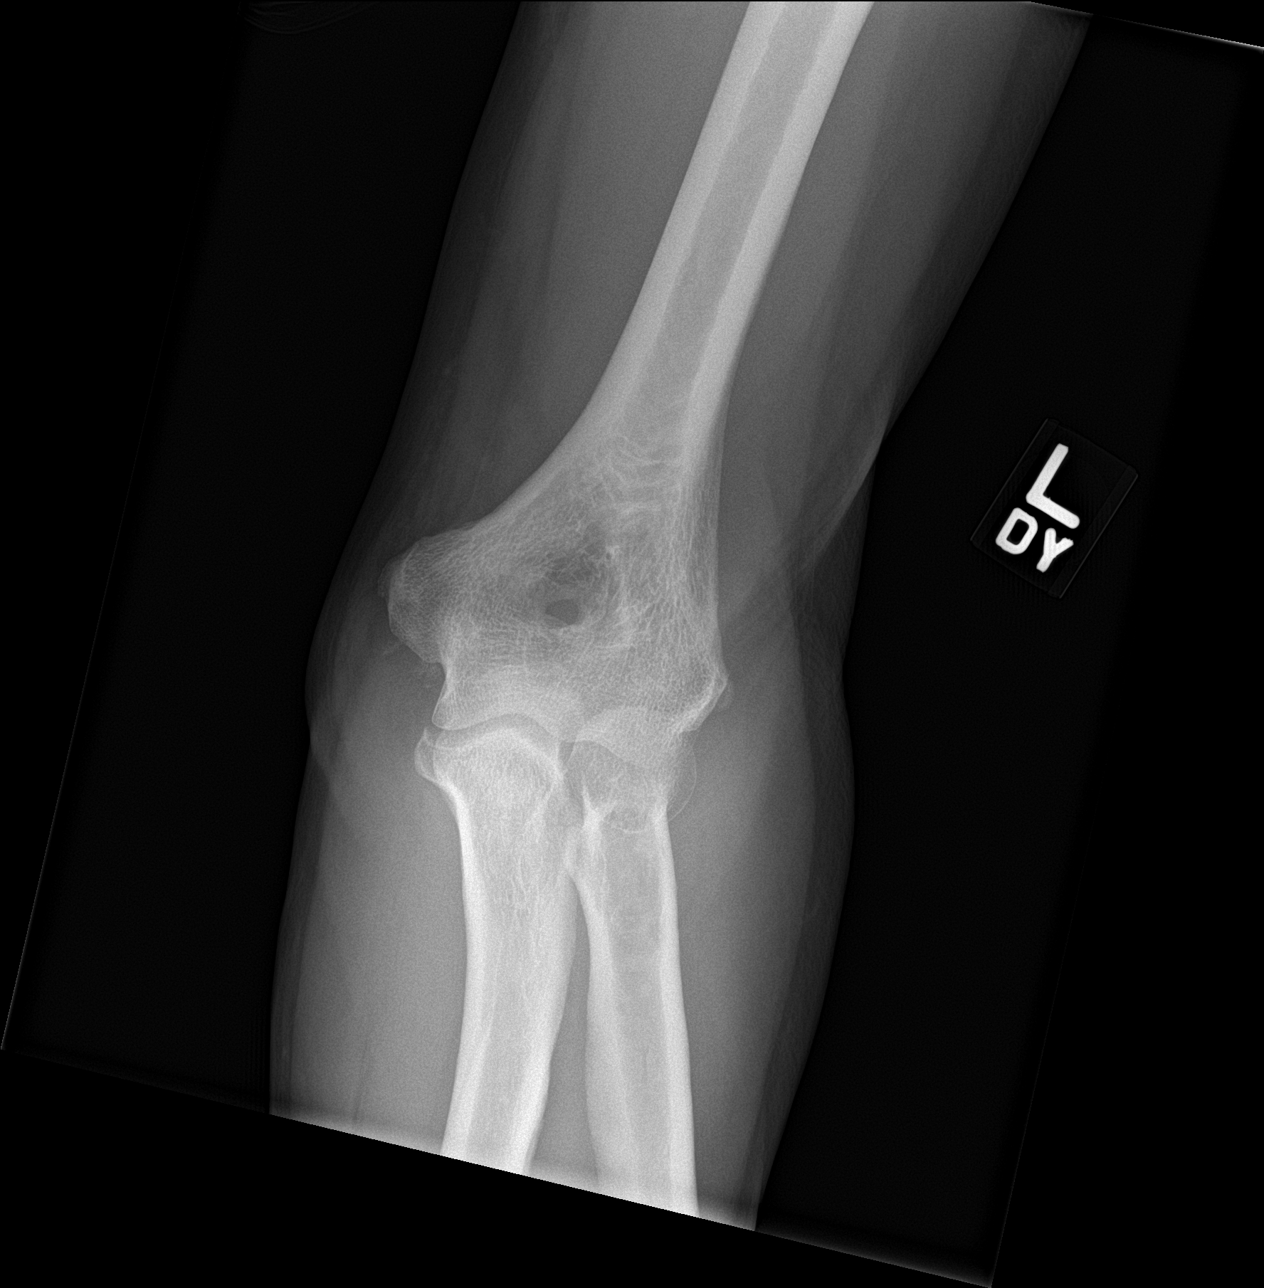

[3 of 3 positions shown; findings below may reference images not displayed]

FINDINGS: The dislocation at the elbow joint has been reduced. The radial head
and proximal ulna appear in anatomic position.

There is an avulsion fracture of coronoid process of the proximal
ulna.

Enthesophyte at the distal triceps insertion. Soft tissue
calcifications adjacent to the medial epicondyle could be chronic or
acute.
IMPRESSION: Reduction of the dislocation at the left elbow. Displaced fracture
of the coronoid process of the proximal ulna.

## 2020-06-14 ENCOUNTER — Other Ambulatory Visit: Payer: Self-pay | Admitting: Physician Assistant

## 2020-06-14 DIAGNOSIS — M25562 Pain in left knee: Secondary | ICD-10-CM

## 2020-06-14 DIAGNOSIS — M2392 Unspecified internal derangement of left knee: Secondary | ICD-10-CM

## 2020-06-21 ENCOUNTER — Ambulatory Visit
Admission: RE | Admit: 2020-06-21 | Discharge: 2020-06-21 | Disposition: A | Discharge status: Home / Self Care | Payer: No Typology Code available for payment source | Source: Ambulatory Visit | Attending: Physician Assistant | Admitting: Physician Assistant

## 2020-06-21 ENCOUNTER — Other Ambulatory Visit: Payer: Self-pay

## 2020-06-21 DIAGNOSIS — M25562 Pain in left knee: Secondary | ICD-10-CM | POA: Diagnosis present

## 2020-06-21 DIAGNOSIS — M2392 Unspecified internal derangement of left knee: Secondary | ICD-10-CM | POA: Diagnosis present

## 2020-11-01 ENCOUNTER — Encounter: Payer: Self-pay | Admitting: Dermatology

## 2020-11-01 ENCOUNTER — Other Ambulatory Visit: Payer: Self-pay

## 2020-11-01 ENCOUNTER — Ambulatory Visit (INDEPENDENT_AMBULATORY_CARE_PROVIDER_SITE_OTHER): Payer: No Typology Code available for payment source | Admitting: Dermatology

## 2020-11-01 DIAGNOSIS — L820 Inflamed seborrheic keratosis: Secondary | ICD-10-CM

## 2020-11-01 DIAGNOSIS — C44612 Basal cell carcinoma of skin of right upper limb, including shoulder: Secondary | ICD-10-CM | POA: Diagnosis not present

## 2020-11-01 DIAGNOSIS — D229 Melanocytic nevi, unspecified: Secondary | ICD-10-CM

## 2020-11-01 DIAGNOSIS — D224 Melanocytic nevi of scalp and neck: Secondary | ICD-10-CM

## 2020-11-01 DIAGNOSIS — C4491 Basal cell carcinoma of skin, unspecified: Secondary | ICD-10-CM

## 2020-11-01 DIAGNOSIS — L570 Actinic keratosis: Secondary | ICD-10-CM

## 2020-11-01 DIAGNOSIS — D485 Neoplasm of uncertain behavior of skin: Secondary | ICD-10-CM

## 2020-11-01 DIAGNOSIS — L578 Other skin changes due to chronic exposure to nonionizing radiation: Secondary | ICD-10-CM | POA: Diagnosis not present

## 2020-11-01 DIAGNOSIS — C44319 Basal cell carcinoma of skin of other parts of face: Secondary | ICD-10-CM | POA: Diagnosis not present

## 2020-11-01 HISTORY — DX: Basal cell carcinoma of skin, unspecified: C44.91

## 2020-11-01 NOTE — Progress Notes (Signed)
Follow-Up Visit   Subjective  Cory Navarro is a 60 y.o. male who presents for the following: Other (3 spots of right forearm that won't heal and a spot of left scalp that is irritating).   The following portions of the chart were reviewed this encounter and updated as appropriate:        Review of Systems:  No other skin or systemic complaints except as noted in HPI or Assessment and Plan.  Objective  Well appearing patient in no apparent distress; mood and affect are within normal limits.  A focused examination was performed including scalp, right forearm. Relevant physical exam findings are noted in the Assessment and Plan.  Left postauricular scalp Erythematous keratotic or waxy stuck-on plaque.   Left Frontal Scalp 2.0 x 1.3 cm Pink flesh lobulated plaque, present since childhood, no changes  Right forearm below elbow 21mm Pink scaly flat papule       Right temple/lat eye 62mm pink flesh papule       Right Forearm x 2, forehead x 6, nasal dorsum x 1, crown scalp x 3 (12) Pink scaly macules x x 2  adjacent to white scar (r forearm) Pink scaly macules face   Assessment & Plan  Inflamed seborrheic keratosis Left postauricular scalp  Additional treatment may be required to clear due to size  Destruction of lesion - Left postauricular scalp  Destruction method: cryotherapy   Informed consent: discussed and consent obtained   Lesion destroyed using liquid nitrogen: Yes   Region frozen until ice ball extended beyond lesion: Yes   Outcome: patient tolerated procedure well with no complications   Post-procedure details: wound care instructions given   Additional details:  Prior to procedure, discussed risks of blister formation, small wound, skin dyspigmentation, or rare scar following cryotherapy. Recommend Vaseline ointment to treated areas while healing.   Sebaceous nevus Left Frontal Scalp  Benign appearing. Stable. Observe.  Neoplasm of uncertain  behavior of skin (2) Right forearm below elbow  Skin / nail biopsy Type of biopsy: tangential   Informed consent: discussed and consent obtained   Patient was prepped and draped in usual sterile fashion: Area prepped with alcohol. Anesthesia: the lesion was anesthetized in a standard fashion   Anesthetic:  0.5% bupivicaine w/ epinephrine 1-100,000 local infiltration Instrument used: flexible razor blade   Hemostasis achieved with: pressure, aluminum chloride and electrodesiccation   Outcome: patient tolerated procedure well   Post-procedure details: wound care instructions given   Post-procedure details comment:  Ointment and small bandage applied  Specimen 1 - Surgical pathology Differential Diagnosis: ISK R/O BCC Check Margins: No  Right temple/lat eye  Skin / nail biopsy Type of biopsy: tangential   Informed consent: discussed and consent obtained   Patient was prepped and draped in usual sterile fashion: Area prepped with alcohol. Anesthesia: the lesion was anesthetized in a standard fashion   Anesthetic:  0.5% bupivicaine w/ epinephrine 1-100,000 local infiltration Instrument used: flexible razor blade   Hemostasis achieved with: pressure, aluminum chloride and electrodesiccation   Outcome: patient tolerated procedure well   Post-procedure details: wound care instructions given   Post-procedure details comment:  Ointment and small bandage applied Additional details:  Possible recurrent BCC (pt has h/o BCC R lateral eye)- excision vrs Mohs if positive  Specimen 2 - Surgical pathology Differential Diagnosis: Sebaceous hyperplasia R/O Recurrent BCC Check Margins: No [id number]  AK (actinic keratosis) (12) Right Forearm x 2, forehead x 6, nasal dorsum x 1, crown scalp  x 3  Actinic keratoses are precancerous spots that appear secondary to cumulative UV radiation exposure/sun exposure over time. They are chronic with expected duration over 1 year. A portion of actinic  keratoses will progress to squamous cell carcinoma of the skin. It is not possible to reliably predict which spots will progress to skin cancer and so treatment is recommended to prevent development of skin cancer.  Recommend daily broad spectrum sunscreen SPF 30+ to sun-exposed areas, reapply every 2 hours as needed.  Recommend staying in the shade or wearing long sleeves, sun glasses (UVA+UVB protection) and wide brim hats (4-inch brim around the entire circumference of the hat). Call for new or changing lesions.  - Start 5-fluorouracil/calcipotriene cream twice a day for 7 days to affected areas including face. Prescription sent to [facility]. Patient advised they will receive an email to purchase the medication online and have it sent to their home. Patient provided with handout reviewing treatment course and side effects and advised to call or message [place] on MyChart with any concerns.   5-fluorouracil/calcipotriene cream is is a type of field treatment used to treat precancers, thin skin cancers, and areas of sun damage. Reviewed expected reaction including irritation and mild inflammation potentially progressing to more severe inflammation including redness, scaling, crusting and open sores/erosions.  Reviewed if too much irritation occurs, ensure application of only a thin layer and decrease frequency of use to achieve a tolerable level of inflammation. Recommend applying Vaseline ointment to open sores as needed.  Minimize sun exposure while under treatment. Recommend daily broad spectrum sunscreen SPF 30+ to sun-exposed areas, reapply every 2 hours as needed.       Destruction of lesion - Right Forearm x 2, forehead x 6, nasal dorsum x 1, crown scalp x 3  Destruction method: cryotherapy   Informed consent: discussed and consent obtained   Lesion destroyed using liquid nitrogen: Yes   Region frozen until ice ball extended beyond lesion: Yes   Outcome: patient  tolerated procedure well with no complications   Post-procedure details: wound care instructions given   Additional details:  Prior to procedure, discussed risks of blister formation, small wound, skin dyspigmentation, or rare scar following cryotherapy. Recommend Vaseline ointment to treated areas while healing.   Actinic Damage - Severe, confluent actinic changes with pre-cancerous actinic keratoses  - Severe, chronic, not at goal, secondary to cumulative UV radiation exposure over time - diffuse scaly erythematous macules and papules with underlying dyspigmentation - Discussed Prescription "Field Treatment" for Severe, Chronic Confluent Actinic Changes with Pre-Cancerous Actinic Keratoses Field treatment involves treatment of an entire area of skin that has confluent Actinic Changes (Sun/ Ultraviolet light damage) and PreCancerous Actinic Keratoses by method of PhotoDynamic Therapy (PDT) and/or prescription Topical Chemotherapy agents such as 5-fluorouracil, 5-fluorouracil/calcipotriene, and/or imiquimod.  The purpose is to decrease the number of clinically evident and subclinical PreCancerous lesions to prevent progression to development of skin cancer by chemically destroying early precancer changes that may or may not be visible.  It has been shown to reduce the risk of developing skin cancer in the treated area. As a result of treatment, redness, scaling, crusting, and open sores may occur during treatment course. One or more than one of these methods may be used and may have to be used several times to control, suppress and eliminate the PreCancerous changes. Discussed treatment course, expected reaction, and possible side effects. - Recommend daily broad spectrum sunscreen SPF 30+ to sun-exposed areas, reapply every  2 hours as needed.  - Staying in the shade or wearing long sleeves, sun glasses (UVA+UVB protection) and wide brim hats (4-inch brim around the entire circumference of the hat) are  also recommended. - Call for new or changing lesions.  -Pt prefers topical 5FU/Vit D cream over PDT.  Will send into [facility]  Return in about 6 months (around 05/04/2021).  I, Ashok Cordia, CMA, am acting as scribe for Brendolyn Patty, MD .  Documentation: I have reviewed the above documentation for accuracy and completeness, and I agree with the above.  Brendolyn Patty MD

## 2020-11-01 NOTE — Patient Instructions (Addendum)
Cryotherapy Aftercare  Wash gently with soap and water everyday.   Apply Vaseline and Band-Aid daily until healed.    Wound Care Instructions  Cleanse wound gently with soap and water once a day then pat dry with clean gauze. Apply a thing coat of Petrolatum (petroleum jelly, "Vaseline") over the wound (unless you have an allergy to this). We recommend that you use a new, sterile tube of Vaseline. Do not pick or remove scabs. Do not remove the yellow or white "healing tissue" from the base of the wound.  Cover the wound with fresh, clean, nonstick gauze and secure with paper tape. You may use Band-Aids in place of gauze and tape if the would is small enough, but would recommend trimming much of the tape off as there is often too much. Sometimes Band-Aids can irritate the skin.  You should call the office for your biopsy report after 1 week if you have not already been contacted.  If you experience any problems, such as abnormal amounts of bleeding, swelling, significant bruising, significant pain, or evidence of infection, please call the office immediately.  FOR ADULT SURGERY PATIENTS: If you need something for pain relief you may take 1 extra strength Tylenol (acetaminophen) AND 2 Ibuprofen (200mg each) together every 4 hours as needed for pain. (do not take these if you are allergic to them or if you have a reason you should not take them.) Typically, you may only need pain medication for 1 to 3 days.     If you have any questions or concerns for your doctor, please call our main line at [phone] and press option 4 to reach your doctor's medical assistant. If no one answers, please leave a voicemail as directed and we will return your call as soon as possible. Messages left after 4 pm will be answered the following business day.   You may also send us a message via MyChart. We typically respond to MyChart messages within 1-2 business days.  For prescription refills, please ask your  pharmacy to contact our office. Our fax number is [phone].  If you have an urgent issue when the clinic is closed that cannot wait until the next business day, you can page your doctor at the number below.    Please note that while we do our best to be available for urgent issues outside of office hours, we are not available 24/7.   If you have an urgent issue and are unable to reach us, you may choose to seek medical care at your doctor's office, retail clinic, urgent care center, or emergency room.  If you have a medical emergency, please immediately call 911 or go to the emergency department.  Pager Numbers  - Dr. Kowalski: [phone]  - Dr. Moye: [phone]  - Dr. Stewart: [phone]  In the event of inclement weather, please call our main line at [phone] for an update on the status of any delays or closures.  Dermatology Medication Tips: Please keep the boxes that topical medications come in in order to help keep track of the instructions about where and how to use these. Pharmacies typically print the medication instructions only on the boxes and not directly on the medication tubes.   If your medication is too expensive, please contact our office at [phone] option 4 or send us a message through MyChart.   We are unable to tell what your co-pay for medications will be in advance as this is different depending on your insurance coverage.   However, we may be able to find a substitute medication at lower cost or fill out paperwork to get insurance to cover a needed medication.   If a prior authorization is required to get your medication covered by your insurance company, please allow us 1-2 business days to complete this process.  Drug prices often vary depending on where the prescription is filled and some pharmacies may offer cheaper prices.  The website www.goodrx.com contains coupons for medications through different pharmacies. The prices here do not  account for what the cost may be with help from insurance (it may be cheaper with your insurance), but the website can give you the price if you did not use any insurance.  - You can print the associated coupon and take it with your prescription to the pharmacy.  - You may also stop by our office during regular business hours and pick up a GoodRx coupon card.  - If you need your prescription sent electronically to a different pharmacy, notify our office through [facility] MyChart or by phone at [phone] option 4.  

## 2020-11-07 ENCOUNTER — Telehealth: Payer: Self-pay

## 2020-11-07 ENCOUNTER — Other Ambulatory Visit: Payer: Self-pay

## 2020-11-07 DIAGNOSIS — C44319 Basal cell carcinoma of skin of other parts of face: Secondary | ICD-10-CM

## 2020-11-07 NOTE — Telephone Encounter (Signed)
-----   Message from Brendolyn Patty, MD sent at 11/07/2020 12:54 PM EDT ----- 1. Skin , right forearm below elbow SUPERFICIAL BASAL CELL CARCINOMA, PERIPHERAL MARGIN INVOLVED 2. Skin , right temple/lateral eye BASAL CELL CARCINOMA, NODULAR PATTERN, BASE INVOLVED  1. Superficial BCC skin cancer- needs EDC 2. BCC skin cancer, recurrent, needs Mohs ([facility] or [facility])  - please call pt

## 2020-11-07 NOTE — Telephone Encounter (Signed)
Patient advised bx results BCC, scheduled for [facility] for right forearm, referral sent to [place] for Moh's at right temple./js

## 2020-11-21 ENCOUNTER — Encounter: Payer: Self-pay | Admitting: Dermatology

## 2020-11-21 ENCOUNTER — Ambulatory Visit (INDEPENDENT_AMBULATORY_CARE_PROVIDER_SITE_OTHER): Payer: No Typology Code available for payment source | Admitting: Dermatology

## 2020-11-21 ENCOUNTER — Other Ambulatory Visit: Payer: Self-pay

## 2020-11-21 DIAGNOSIS — L578 Other skin changes due to chronic exposure to nonionizing radiation: Secondary | ICD-10-CM | POA: Diagnosis not present

## 2020-11-21 DIAGNOSIS — C44612 Basal cell carcinoma of skin of right upper limb, including shoulder: Secondary | ICD-10-CM

## 2020-11-21 DIAGNOSIS — C44319 Basal cell carcinoma of skin of other parts of face: Secondary | ICD-10-CM

## 2020-11-21 NOTE — Progress Notes (Signed)
   Follow-Up Visit   Subjective  Cory Navarro is a 60 y.o. male who presents for the following: BCC bx proven (R forearm below elbow, R temple lat eye, pt scheduled for [facility] for the R temple/lat eye 12/27/20 and pt presents for [facility] to R forearm today).   The following portions of the chart were reviewed this encounter and updated as appropriate:       Review of Systems:  No other skin or systemic complaints except as noted in HPI or Assessment and Plan.  Objective  Well appearing patient in no apparent distress; mood and affect are within normal limits.  A focused examination was performed including face, R arm. Relevant physical exam findings are noted in the Assessment and Plan.  Right Forearm below elbow Pink bx site  R temple/lat eye Pink bx site   Assessment & Plan   Actinic Damage - chronic, secondary to cumulative UV radiation exposure/sun exposure over time - diffuse scaly erythematous macules with underlying dyspigmentation - Recommend daily broad spectrum sunscreen SPF 30+ to sun-exposed areas, reapply every 2 hours as needed.  - Recommend staying in the shade or wearing long sleeves, sun glasses (UVA+UVB protection) and wide brim hats (4-inch brim around the entire circumference of the hat). - Call for new or changing lesions.   Basal cell carcinoma (BCC) of skin of right upper extremity including shoulder Right Forearm below elbow  Destruction of lesion  Destruction method: electrodesiccation and curettage   Informed consent: discussed and consent obtained   Timeout:  patient name, date of birth, surgical site, and procedure verified Curettage performed in three different directions: Yes   Electrodesiccation performed over the curetted area: Yes   Lesion length (cm):  0.8 Lesion width (cm):  0.8 Margin per side (cm):  0.2 Final wound size (cm):  1.2 Hemostasis achieved with:  pressure, aluminum chloride and electrodesiccation Outcome: patient tolerated  procedure well with no complications   Post-procedure details: wound care instructions given    Basal cell carcinoma (BCC) of skin of other part of face R temple/lat eye  Bx proven recurrent BCC Pt scheduled for [facility] 12/27/20  Return for as scheduled for 41mf/u.  I, SOthelia Pulling RMA, am acting as scribe for TBrendolyn Patty MD .  Documentation: I have reviewed the above documentation for accuracy and completeness, and I agree with the above.  TBrendolyn PattyMD

## 2020-11-21 NOTE — Patient Instructions (Signed)
If you have any questions or concerns for your doctor, please call our main line at [phone] and press option 4 to reach your doctor's medical assistant. If no one answers, please leave a voicemail as directed and we will return your call as soon as possible. Messages left after 4 pm will be answered the following business day.   You may also send us a message via MyChart. We typically respond to MyChart messages within 1-2 business days.  For prescription refills, please ask your pharmacy to contact our office. Our fax number is [phone].  If you have an urgent issue when the clinic is closed that cannot wait until the next business day, you can page your doctor at the number below.    Please note that while we do our best to be available for urgent issues outside of office hours, we are not available 24/7.   If you have an urgent issue and are unable to reach us, you may choose to seek medical care at your doctor's office, retail clinic, urgent care center, or emergency room.  If you have a medical emergency, please immediately call 911 or go to the emergency department.  Pager Numbers  - Dr. Kowalski: [phone]  - Dr. Moye: [phone]  - Dr. Stewart: [phone]  In the event of inclement weather, please call our main line at [phone] for an update on the status of any delays or closures.  Dermatology Medication Tips: Please keep the boxes that topical medications come in in order to help keep track of the instructions about where and how to use these. Pharmacies typically print the medication instructions only on the boxes and not directly on the medication tubes.   If your medication is too expensive, please contact our office at [phone] option 4 or send us a message through MyChart.   We are unable to tell what your co-pay for medications will be in advance as this is different depending on your insurance coverage. However, we may be able to find a substitute  medication at lower cost or fill out paperwork to get insurance to cover a needed medication.   If a prior authorization is required to get your medication covered by your insurance company, please allow us 1-2 business days to complete this process.  Drug prices often vary depending on where the prescription is filled and some pharmacies may offer cheaper prices.  The website www.goodrx.com contains coupons for medications through different pharmacies. The prices here do not account for what the cost may be with help from insurance (it may be cheaper with your insurance), but the website can give you the price if you did not use any insurance.  - You can print the associated coupon and take it with your prescription to the pharmacy.  - You may also stop by our office during regular business hours and pick up a GoodRx coupon card.  - If you need your prescription sent electronically to a different pharmacy, notify our office through [facility] MyChart or by phone at [phone] option 4.   Wound Care Instructions  Cleanse wound gently with soap and water once a day then pat dry with clean gauze. Apply a thing coat of Petrolatum (petroleum jelly, "Vaseline") over the wound (unless you have an allergy to this). We recommend that you use a new, sterile tube of Vaseline. Do not pick or remove scabs. Do not remove the yellow or white "healing tissue" from the base of the wound.  Cover the   wound with fresh, clean, nonstick gauze and secure with paper tape. You may use Band-Aids in place of gauze and tape if the would is small enough, but would recommend trimming much of the tape off as there is often too much. Sometimes Band-Aids can irritate the skin.  You should call the office for your biopsy report after 1 week if you have not already been contacted.  If you experience any problems, such as abnormal amounts of bleeding, swelling, significant bruising, significant pain, or evidence of infection,  please call the office immediately.  FOR ADULT SURGERY PATIENTS: If you need something for pain relief you may take 1 extra strength Tylenol (acetaminophen) AND 2 Ibuprofen (200mg each) together every 4 hours as needed for pain. (do not take these if you are allergic to them or if you have a reason you should not take them.) Typically, you may only need pain medication for 1 to 3 days.    

## 2021-05-08 ENCOUNTER — Ambulatory Visit (INDEPENDENT_AMBULATORY_CARE_PROVIDER_SITE_OTHER): Payer: No Typology Code available for payment source | Admitting: Dermatology

## 2021-05-08 ENCOUNTER — Other Ambulatory Visit: Payer: Self-pay

## 2021-05-08 ENCOUNTER — Encounter: Payer: Self-pay | Admitting: Dermatology

## 2021-05-08 DIAGNOSIS — L578 Other skin changes due to chronic exposure to nonionizing radiation: Secondary | ICD-10-CM | POA: Diagnosis not present

## 2021-05-08 DIAGNOSIS — Z85820 Personal history of malignant melanoma of skin: Secondary | ICD-10-CM

## 2021-05-08 DIAGNOSIS — L905 Scar conditions and fibrosis of skin: Secondary | ICD-10-CM

## 2021-05-08 DIAGNOSIS — Z85828 Personal history of other malignant neoplasm of skin: Secondary | ICD-10-CM

## 2021-05-08 DIAGNOSIS — L570 Actinic keratosis: Secondary | ICD-10-CM | POA: Diagnosis not present

## 2021-05-08 DIAGNOSIS — Z872 Personal history of diseases of the skin and subcutaneous tissue: Secondary | ICD-10-CM

## 2021-05-08 NOTE — Progress Notes (Signed)
Follow-Up Visit   Subjective  Cory Navarro is a 61 y.o. male who presents for the following: Follow-up (Patient here to follow up on [facility] at right temple, treated with Moh's. Patient also with hx of AK's and does have some areas at forehead he would like checked. Patient has a bx proven BCC at right forearm below elbow that he would like rechecked. ).  BCC at right forearm was treated with [facility] in July 2022. Patient treated AK's at face with [employer] 5FU/calcipotriene last year.   The following portions of the chart were reviewed this encounter and updated as appropriate:   Tobacco   Allergies   Meds   Problems   Med Hx   Surg Hx   Fam Hx       Review of Systems:  No other skin or systemic complaints except as noted in HPI or Assessment and Plan.  Objective  Well appearing patient in no apparent distress; mood and affect are within normal limits.  A focused examination was performed including face, scalp, arms. Relevant physical exam findings are noted in the Assessment and Plan.  right forearm lat to EDC site x 1, right upper wrist x 1, right lateral elbow x 1, vertex scalp x 3, left forehead x 4, right forehead x 2, right temple x 1, left nasal dorsum x 1, right lower cheek x 1 (15) Erythematous thin papules/macules with gritty scale.   right forearm below elbow Slightly hypertrophic, pink slightly firm papule       Assessment & Plan  AK (actinic keratosis) (15) right forearm lat to EDC site x 1, right upper wrist x 1, right lateral elbow x 1, vertex scalp x 3, left forehead x 4, right forehead x 2, right temple x 1, left nasal dorsum x 1, right lower cheek x 1  Actinic keratoses are precancerous spots that appear secondary to cumulative UV radiation exposure/sun exposure over time. They are chronic with expected duration over 1 year. A portion of actinic keratoses will progress to squamous cell carcinoma of the skin. It is not possible to reliably predict which spots  will progress to skin cancer and so treatment is recommended to prevent development of skin cancer.  Recommend daily broad spectrum sunscreen SPF 30+ to sun-exposed areas, reapply every 2 hours as needed.  Recommend staying in the shade or wearing long sleeves, sun glasses (UVA+UVB protection) and wide brim hats (4-inch brim around the entire circumference of the hat). Call for new or changing lesions.   Destruction of lesion - right forearm lat to EDC site x 1, right upper wrist x 1, right lateral elbow x 1, vertex scalp x 3, left forehead x 4, right forehead x 2, right temple x 1, left nasal dorsum x 1, right lower cheek x 1  Destruction method: cryotherapy   Informed consent: discussed and consent obtained   Lesion destroyed using liquid nitrogen: Yes   Region frozen until ice ball extended beyond lesion: Yes   Outcome: patient tolerated procedure well with no complications   Post-procedure details: wound care instructions given   Additional details:  Prior to procedure, discussed risks of blister formation, small wound, skin dyspigmentation, or rare scar following cryotherapy. Recommend Vaseline ointment to treated areas while healing.   Scar right forearm below elbow  S/p EDC for bx proven BCC  Clear. Observe for recurrence. Call clinic for new or changing lesions.  Recommend regular skin exams, daily broad-spectrum spf 30+ sunscreen use, and photoprotection.  Will recheck on f/up.   Actinic Damage - Severe, confluent actinic changes with pre-cancerous actinic keratoses  - Severe, chronic, not at goal, secondary to cumulative UV radiation exposure over time - diffuse scaly erythematous macules and papules with underlying dyspigmentation - Discussed Prescription "Field Treatment" for Severe, Chronic Confluent Actinic Changes with Pre-Cancerous Actinic Keratoses Field treatment involves treatment of an entire area of skin that has confluent Actinic Changes (Sun/ Ultraviolet light  damage) and PreCancerous Actinic Keratoses by method of PhotoDynamic Therapy (PDT) and/or prescription Topical Chemotherapy agents such as 5-fluorouracil, 5-fluorouracil/calcipotriene, and/or imiquimod.  The purpose is to decrease the number of clinically evident and subclinical PreCancerous lesions to prevent progression to development of skin cancer by chemically destroying early precancer changes that may or may not be visible.  It has been shown to reduce the risk of developing skin cancer in the treated area. As a result of treatment, redness, scaling, crusting, and open sores may occur during treatment course. One or more than one of these methods may be used and may have to be used several times to control, suppress and eliminate the PreCancerous changes. Discussed treatment course, expected reaction, and possible side effects. - Recommend daily broad spectrum sunscreen SPF 30+ to sun-exposed areas, reapply every 2 hours as needed.  - Staying in the shade or wearing long sleeves, sun glasses (UVA+UVB protection) and wide brim hats (4-inch brim around the entire circumference of the hat) are also recommended. - Call for new or changing lesions. - Patient has 5FU/calcipotriene and will retreat areas at face twice daily for up to 1 week.  History of Basal Cell Carcinoma of the Skin - No evidence of recurrence today, multiple sites - Recommend regular full body skin exams - Recommend daily broad spectrum sunscreen SPF 30+ to sun-exposed areas, reapply every 2 hours as needed.  - Call if any new or changing lesions are noted between office visits  History of Melanoma 2006 - No evidence of recurrence today - Recommend regular full body skin exams - Recommend daily broad spectrum sunscreen SPF 30+ to sun-exposed areas, reapply every 2 hours as needed.  - Call if any new or changing lesions are noted between office visits  History of PreCancerous Actinic Keratosis  - site(s) of PreCancerous Actinic  Keratosis clear today. - these may recur and new lesions may form requiring treatment to prevent transformation into skin cancer - observe for new or changing spots and contact [place] for appointment if occur - photoprotection with sun protective clothing; sunglasses and broad spectrum sunscreen with SPF of at least 30 + and frequent self skin exams recommended - yearly exams by a dermatologist recommended for persons with history of PreCancerous Actinic Keratoses  Return in about 6 months (around 11/05/2021) for AK f/up, h/o BCCs.  Graciella Belton, RMA, am acting as scribe for Brendolyn Patty, MD .   Documentation: I have reviewed the above documentation for accuracy and completeness, and I agree with the above.  Brendolyn Patty MD

## 2021-05-08 NOTE — Patient Instructions (Addendum)
Cryotherapy Aftercare  Wash gently with soap and water everyday.   Apply Vaseline and Band-Aid daily until healed.    5-Fluorouracil/Calcipotriene Patient Education   Actinic keratoses are the dry, red scaly spots on the skin caused by sun damage. A portion of these spots can turn into skin cancer with time, and treating them can help prevent development of skin cancer.   Treatment of these spots requires removal of the defective skin cells. There are various ways to remove actinic keratoses, including freezing with liquid nitrogen, treatment with creams, or treatment with a blue light procedure in the office.   5-fluorouracil cream is a topical cream used to treat actinic keratoses. It works by interfering with the growth of abnormal fast-growing skin cells, such as actinic keratoses. These cells peel off and are replaced by healthy ones.   5-fluorouracil/calcipotriene is a combination of the 5-fluorouracil cream with a vitamin D analog cream called calcipotriene. The calcipotriene alone does not treat actinic keratoses. However, when it is combined with 5-fluorouracil, it helps the 5-fluorouracil treat the actinic keratoses much faster so that the same results can be achieved with a much shorter treatment time.  INSTRUCTIONS FOR 5-FLUOROURACIL/CALCIPOTRIENE CREAM:   5-fluorouracil/calcipotriene cream typically only needs to be used for 4-7 days. A thin layer should be applied twice a day to the treatment areas recommended by your physician.   If your physician prescribed you separate tubes of 5-fluourouracil and calcipotriene, apply a thin layer of 5-fluorouracil followed by a thin layer of calcipotriene.   Avoid contact with your eyes, nostrils, and mouth. Do not use 5-fluorouracil/calcipotriene cream on infected or open wounds.   You will develop redness, irritation and some crusting at areas where you have pre-cancer damage/actinic keratoses. IF YOU DEVELOP PAIN, BLEEDING, OR SIGNIFICANT  CRUSTING, STOP THE TREATMENT EARLY - you have already gotten a good response and the actinic keratoses should clear up well.  Wash your hands after applying 5-fluorouracil 5% cream on your skin.   A moisturizer or sunscreen with a minimum SPF 30 should be applied each morning.   Once you have finished the treatment, you can apply a thin layer of Vaseline twice a day to irritated areas to soothe and calm the areas more quickly. If you experience significant discomfort, contact your physician.  For some patients it is necessary to repeat the treatment for best results.  SIDE EFFECTS: When using 5-fluorouracil/calcipotriene cream, you may have mild irritation, such as redness, dryness, swelling, or a mild burning sensation. This usually resolves within 2 weeks. The more actinic keratoses you have, the more redness and inflammation you can expect during treatment. Eye irritation has been reported rarely. If this occurs, please let us know.  If you have any trouble using this cream, please call the office. If you have any other questions about this information, please do not hesitate to ask me before you leave the office.  Melanoma ABCDEs  Melanoma is the most dangerous type of skin cancer, and is the leading cause of death from skin disease.  You are more likely to develop melanoma if you: Have light-colored skin, light-colored eyes, or red or blond hair Spend a lot of time in the sun Tan regularly, either outdoors or in a tanning bed Have had blistering sunburns, especially during childhood Have a close family member who has had a melanoma Have atypical moles or large birthmarks  Early detection of melanoma is key since treatment is typically straightforward and cure rates are extremely high if  we catch it early.   The first sign of melanoma is often a change in a mole or a new dark spot.  The ABCDE system is a way of remembering the signs of melanoma.  A for asymmetry:  The two halves do not  match. B for border:  The edges of the growth are irregular. C for color:  A mixture of colors are present instead of an even brown color. D for diameter:  Melanomas are usually (but not always) greater than 17mm - the size of a pencil eraser. E for evolution:  The spot keeps changing in size, shape, and color.  Please check your skin once per month between visits. You can use a small mirror in front and a large mirror behind you to keep an eye on the back side or your body.   If you see any new or changing lesions before your next follow-up, please call to schedule a visit.  Please continue daily skin protection including broad spectrum sunscreen SPF 30+ to sun-exposed areas, reapplying every 2 hours as needed when you're outdoors.    If You Need Anything After Your Visit  If you have any questions or concerns for your doctor, please call our main line at [phone] and press option 4 to reach your doctor's medical assistant. If no one answers, please leave a voicemail as directed and we will return your call as soon as possible. Messages left after 4 pm will be answered the following business day.   You may also send [place] a message via [place]. We typically respond to MyChart messages within 1-2 business days.  For prescription refills, please ask your pharmacy to contact our office. Our fax number is [phone].  If you have an urgent issue when the clinic is closed that cannot wait until the next business day, you can page your doctor at the number below.    Please note that while we do our best to be available for urgent issues outside of office hours, we are not available 24/7.   If you have an urgent issue and are unable to reach [place], you may choose to seek medical care at your doctor's office, retail clinic, urgent care center, or emergency room.  If you have a medical emergency, please immediately call 911 or go to the emergency department.  Pager Numbers  - Dr. Nehemiah Massed:  [phone]  - Dr. Laurence Ferrari: [phone]  - Dr. Nicole Kindred: [phone]  In the event of inclement weather, please call our main line at [phone] for an update on the status of any delays or closures.  Dermatology Medication Tips: Please keep the boxes that topical medications come in in order to help keep track of the instructions about where and how to use these. Pharmacies typically print the medication instructions only on the boxes and not directly on the medication tubes.   If your medication is too expensive, please contact our office at [phone] option 4 or send [place] a message through [place].   We are unable to tell what your co-pay for medications will be in advance as this is different depending on your insurance coverage. However, we may be able to find a substitute medication at lower cost or fill out paperwork to get insurance to cover a needed medication.   If a prior authorization is required to get your medication covered by your insurance company, please allow [place] 1-2 business days to complete this process.  Drug prices often vary depending on where the prescription  is filled and some pharmacies may offer cheaper prices.  The website www.goodrx.com contains coupons for medications through different pharmacies. The prices here do not account for what the cost may be with help from insurance (it may be cheaper with your insurance), but the website can give you the price if you did not use any insurance.  - You can print the associated coupon and take it with your prescription to the pharmacy.  - You may also stop by our office during regular business hours and pick up a GoodRx coupon card.  - If you need your prescription sent electronically to a different pharmacy, notify our office through [facility] or by phone at [phone] option 4.     Si Usted Necesita Algo Despus de Su Visita  Tambin puede enviarnos un mensaje a travs de Pharmacist, community. Por lo general  respondemos a los mensajes de MyChart en el transcurso de 1 a 2 das hbiles.  Para renovar recetas, por favor pida a su farmacia que se ponga en contacto con nuestra oficina. Harland Dingwall de fax es [place] [phone].  Si tiene un asunto urgente cuando la clnica est cerrada y que no puede esperar hasta el siguiente da hbil, puede llamar/localizar a su doctor(a) al nmero que aparece a continuacin.   Por favor, tenga en cuenta que aunque hacemos todo lo posible para estar disponibles para asuntos urgentes fuera del horario de [place], no estamos disponibles las 24 horas del da, los 7 das de la [place].   Si tiene un problema urgente y no puede comunicarse con nosotros, puede optar por buscar atencin mdica  en el consultorio de su doctor(a), en una clnica privada, en un centro de atencin urgente o en una sala de emergencias.  Si tiene Engineering geologist, por favor llame inmediatamente al 911 o vaya a la sala de emergencias.  Nmeros de bper  - Dr. Nehemiah Massed: [phone]  - Dra. Moye: [phone]  - Dra. Nicole Kindred: [phone]  En caso de inclemencias del [place], por favor llame a Johnsie Kindred principal al [phone] para una actualizacin sobre el [place] de cualquier retraso o cierre.  Consejos para la medicacin en dermatologa: Por favor, guarde las cajas en las que vienen los medicamentos de uso tpico para ayudarle a seguir las instrucciones sobre dnde y cmo usarlos. Las farmacias generalmente imprimen las instrucciones del medicamento slo en las cajas y no directamente en los tubos del [place].   Si su medicamento es muy caro, por favor, pngase en contacto con Zigmund Daniel llamando al [phone] y presione la opcin 4 o envenos un mensaje a travs de Pharmacist, community.   No podemos decirle cul ser su copago por los medicamentos por adelantado ya que esto es diferente dependiendo de la cobertura de su seguro. Sin embargo, es posible que podamos encontrar un  medicamento sustituto a Electrical engineer un formulario para que el seguro cubra el medicamento que se considera necesario.   Si se requiere una autorizacin previa para que su compaa de seguros [place] su medicamento, por favor permtanos de 1 a 2 das hbiles para completar este proceso.  Los precios de los medicamentos varan con frecuencia dependiendo del Environmental consultant de dnde se surte la receta y alguna farmacias pueden ofrecer precios ms baratos.  El sitio web www.goodrx.com tiene cupones para medicamentos de Airline pilot. Los precios aqu no tienen en cuenta lo que podra costar con la ayuda del seguro (puede ser ms barato con su seguro), pero el sitio web puede  darle el precio si no utiliz [employer].  - Puede imprimir el cupn correspondiente y llevarlo con su receta a la farmacia.  - Tambin puede pasar por nuestra oficina durante el horario de atencin regular y Charity fundraiser una tarjeta de cupones de GoodRx.  - Si necesita que su receta se enve electrnicamente a una farmacia diferente, informe a nuestra oficina a travs de MyChart de [place] o por telfono llamando al [phone] y presione la opcin 4.

## 2021-10-10 ENCOUNTER — Telehealth: Payer: Self-pay

## 2021-10-10 NOTE — Telephone Encounter (Signed)
Pt wife called requesting that we need send any biopsy or labs to [facility], she would like all his pathology and labs sent to [facility].

## 2021-10-24 ENCOUNTER — Ambulatory Visit: Payer: No Typology Code available for payment source | Admitting: Dermatology

## 2021-11-08 ENCOUNTER — Ambulatory Visit (INDEPENDENT_AMBULATORY_CARE_PROVIDER_SITE_OTHER): Payer: No Typology Code available for payment source | Admitting: Dermatology

## 2021-11-08 ENCOUNTER — Encounter: Payer: Self-pay | Admitting: Dermatology

## 2021-11-08 DIAGNOSIS — L578 Other skin changes due to chronic exposure to nonionizing radiation: Secondary | ICD-10-CM

## 2021-11-08 DIAGNOSIS — Z85820 Personal history of malignant melanoma of skin: Secondary | ICD-10-CM | POA: Diagnosis not present

## 2021-11-08 DIAGNOSIS — C4441 Basal cell carcinoma of skin of scalp and neck: Secondary | ICD-10-CM

## 2021-11-08 DIAGNOSIS — L570 Actinic keratosis: Secondary | ICD-10-CM

## 2021-11-08 DIAGNOSIS — Z85828 Personal history of other malignant neoplasm of skin: Secondary | ICD-10-CM | POA: Diagnosis not present

## 2021-11-08 DIAGNOSIS — D224 Melanocytic nevi of scalp and neck: Secondary | ICD-10-CM | POA: Diagnosis not present

## 2021-11-08 DIAGNOSIS — L820 Inflamed seborrheic keratosis: Secondary | ICD-10-CM | POA: Diagnosis not present

## 2021-11-08 DIAGNOSIS — D492 Neoplasm of unspecified behavior of bone, soft tissue, and skin: Secondary | ICD-10-CM

## 2021-11-08 DIAGNOSIS — D229 Melanocytic nevi, unspecified: Secondary | ICD-10-CM

## 2021-11-08 NOTE — Progress Notes (Signed)
Follow-Up Visit   Subjective  Cory Navarro is a 61 y.o. male who presents for the following: Actinic Keratosis (6 month recheck. Tx with LN2 and 5FU/Calcipotriene. Has residual lesions but has had good response with topical treatment. Face, arms). He has a itchy growth in scalp behind ear, previously frozen one year ago, smaller but not clear.  The patient has spots, moles and lesions to be evaluated, some may be new or changing and the patient has concerns that these could be cancer.   The following portions of the chart were reviewed this encounter and updated as appropriate:      Review of Systems: No other skin or systemic complaints except as noted in HPI or Assessment and Plan.   Objective  Well appearing patient in no apparent distress; mood and affect are within normal limits.  A focused examination was performed including scalp, face, arms, hands, neck. Relevant physical exam findings are noted in the Assessment and Plan.  Left Postauricular Area 1.7 cm pink pearly plaque       Right mid Forearm x1, left post ear x1, right temple hair line x1, right cheek x2, right forehead x1, right nose x1, left temple x2, left zygoma x1, left preauricular x1, left ear x2, scalp x7 (20) Erythematous thin papules/macules with gritty scale.   Left Postauricular Scalp x1 Erythematous keratotic or waxy stuck-on papule.  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 Bx 11/01/2020. [facility]. See history, multiple BCCs. - No evidence of recurrence today - Recommend regular full body skin exams - Recommend daily broad spectrum sunscreen SPF 30+ to sun-exposed areas, reapply every 2 hours as needed.  - Call if any new or changing lesions are noted between office visits   History of Melanoma. Left occipital hair line. 2006. - No evidence of recurrence today - Recommend  regular full body skin exams - Recommend daily broad spectrum sunscreen SPF 30+ to sun-exposed areas, reapply every 2 hours as needed.  - Call if any new or changing lesions are noted between office visits    Actinic Damage - Severe, confluent actinic changes with pre-cancerous actinic keratoses  - Severe, chronic, not at goal, secondary to cumulative UV radiation exposure over time - diffuse scaly erythematous macules and papules with underlying dyspigmentation - Discussed Prescription "Field Treatment" for Severe, Chronic Confluent Actinic Changes with Pre-Cancerous Actinic Keratoses Field treatment involves treatment of an entire area of skin that has confluent Actinic Changes (Sun/ Ultraviolet light damage) and PreCancerous Actinic Keratoses by method of PhotoDynamic Therapy (PDT) and/or prescription Topical Chemotherapy agents such as 5-fluorouracil, 5-fluorouracil/calcipotriene, and/or imiquimod.  The purpose is to decrease the number of clinically evident and subclinical PreCancerous lesions to prevent progression to development of skin cancer by chemically destroying early precancer changes that may or may not be visible.  It has been shown to reduce the risk of developing skin cancer in the treated area. As a result of treatment, redness, scaling, crusting, and open sores may occur during treatment course. One or more than one of these methods may be used and may have to be used several times to control, suppress and eliminate the PreCancerous changes. Discussed treatment course, expected reaction, and possible side effects. - Recommend daily broad spectrum sunscreen SPF 30+ to sun-exposed areas, reapply every 2 hours as needed.  - Staying in the shade or wearing long sleeves,  sun glasses (UVA+UVB protection) and wide brim hats (4-inch brim around the entire circumference of the hat) are also recommended. - Call for new or changing lesions. - Patient has 5FU/calcipotriene and instead of spot  treating, will retreat entire forehead and temples twice daily for up to 1 week.  Neoplasm of skin Left Postauricular Area  Skin / nail biopsy Type of biopsy: tangential   Informed consent: discussed and consent obtained   Anesthesia: the lesion was anesthetized in a standard fashion   Anesthesia comment:  Area prepped with alcohol Anesthetic:  1% lidocaine w/ epinephrine 1-100,000 buffered w/ 8.4% NaHCO3 Instrument used: flexible razor blade   Hemostasis achieved with: pressure, aluminum chloride and electrodesiccation   Outcome: patient tolerated procedure well   Post-procedure details: wound care instructions given   Post-procedure details comment:  Ointment and small bandage applied  R/o BCC,  If +skin cancer plan Mohs with Dr. Lacinda Axon.  Related Procedures Anatomic Pathology Report  AK (actinic keratosis) (20) Right mid Forearm x1, left post ear x1, right temple hair line x1, right cheek x2, right forehead x1, right nose x1, left temple x2, left zygoma x1, left preauricular x1, left ear x2, scalp x7  Actinic keratoses are precancerous spots that appear secondary to cumulative UV radiation exposure/sun exposure over time. They are chronic with expected duration over 1 year. A portion of actinic keratoses will progress to squamous cell carcinoma of the skin. It is not possible to reliably predict which spots will progress to skin cancer and so treatment is recommended to prevent development of skin cancer.  Recommend daily broad spectrum sunscreen SPF 30+ to sun-exposed areas, reapply every 2 hours as needed.  Recommend staying in the shade or wearing long sleeves, sun glasses (UVA+UVB protection) and wide brim hats (4-inch brim around the entire circumference of the hat). Call for new or changing lesions.  Destruction of lesion - Right mid Forearm x1, left post ear x1, right temple hair line x1, right cheek x2, right forehead x1, right nose x1, left temple x2, left zygoma x1, left  preauricular x1, left ear x2, scalp x7  Destruction method: cryotherapy   Informed consent: discussed and consent obtained   Lesion destroyed using liquid nitrogen: Yes   Region frozen until ice ball extended beyond lesion: Yes   Outcome: patient tolerated procedure well with no complications   Post-procedure details: wound care instructions given   Additional details:  Prior to procedure, discussed risks of blister formation, small wound, skin dyspigmentation, or rare scar following cryotherapy. Recommend Vaseline ointment to treated areas while healing.   Inflamed seborrheic keratosis Left Postauricular Scalp x1  Symptomatic, irritating, patient would like treated.  Destruction of lesion - Left Postauricular Scalp x1  Destruction method: cryotherapy   Informed consent: discussed and consent obtained   Lesion destroyed using liquid nitrogen: Yes   Region frozen until ice ball extended beyond lesion: Yes   Outcome: patient tolerated procedure well with no complications   Post-procedure details: wound care instructions given   Additional details:  Prior to procedure, discussed risks of blister formation, small wound, skin dyspigmentation, or rare scar following cryotherapy. Recommend Vaseline ointment to treated areas while healing.   Sebaceous nevus Left parietal scalp  Benign-appearing.  Observation.  Call clinic for new or changing lesions.  Recommend daily use of broad spectrum spf 30+ sunscreen to sun-exposed areas.     Return in about 6 months (around 05/11/2022) for TBSE, Hx of MM.  I, Emelia Salisbury, CMA, am acting  as scribe for Brendolyn Patty, MD.  Documentation: I have reviewed the above documentation for accuracy and completeness, and I agree with the above.  Brendolyn Patty MD

## 2021-11-08 NOTE — Patient Instructions (Addendum)
Cryotherapy Aftercare  Wash gently with soap and water everyday.   Apply Vaseline daily until healed.   Wound Care Instructions  Cleanse wound gently with soap and water once a day then pat dry with clean gauze. Apply a thing coat of Petrolatum (petroleum jelly, "Vaseline") over the wound (unless you have an allergy to this). We recommend that you use a new, sterile tube of Vaseline. Do not pick or remove scabs. Do not remove the yellow or white "healing tissue" from the base of the wound.  Cover the wound with fresh, clean, nonstick gauze and secure with paper tape. You may use Band-Aids in place of gauze and tape if the would is small enough, but would recommend trimming much of the tape off as there is often too much. Sometimes Band-Aids can irritate the skin.  You should call the office for your biopsy report after 1 week if you have not already been contacted.  If you experience any problems, such as abnormal amounts of bleeding, swelling, significant bruising, significant pain, or evidence of infection, please call the office immediately.  FOR ADULT SURGERY PATIENTS: If you need something for pain relief you may take 1 extra strength Tylenol (acetaminophen) AND 2 Ibuprofen ('200mg'$  each) together every 4 hours as needed for pain. (do not take these if you are allergic to them or if you have a reason you should not take them.) Typically, you may only need pain medication for 1 to 3 days.      Due to recent changes in healthcare laws, you may see results of your pathology and/or laboratory studies on MyChart before the doctors have had a chance to review them. We understand that in some cases there may be results that are confusing or concerning to you. Please understand that not all results are received at the same time and often the doctors may need to interpret multiple results in order to provide you with the best plan of care or course of treatment. Therefore, we ask that you please give  [place] 2 business days to thoroughly review all your results before contacting the office for clarification. Should we see a critical lab result, you will be contacted sooner.   If You Need Anything After Your Visit  If you have any questions or concerns for your doctor, please call our main line at [phone] and press option 4 to reach your doctor's medical assistant. If no one answers, please leave a voicemail as directed and we will return your call as soon as possible. Messages left after 4 pm will be answered the following business day.   You may also send [place] a message via [place]. We typically respond to MyChart messages within 1-2 business days.  For prescription refills, please ask your pharmacy to contact our office. Our fax number is [phone].  If you have an urgent issue when the clinic is closed that cannot wait until the next business day, you can page your doctor at the number below.    Please note that while we do our best to be available for urgent issues outside of office hours, we are not available 24/7.   If you have an urgent issue and are unable to reach [place], you may choose to seek medical care at your doctor's office, retail clinic, urgent care center, or emergency room.  If you have a medical emergency, please immediately call 911 or go to the emergency department.  Pager Numbers  - Dr. Nehemiah Massed: [phone]  - Dr.  [phone]  - Dr. Moye: [phone]  - Dr. Stewart: [phone]  In the event of inclement weather, please call our main line at [phone] for an update on the status of any delays or closures.  Dermatology Medication Tips: Please keep the boxes that topical medications come in in order to help keep track of the instructions about where and how to use these. Pharmacies typically print the medication instructions only on the boxes and not directly on the medication tubes.   If your medication is too expensive, please contact our office at  [phone] option 4 or send us a message through MyChart.   We are unable to tell what your co-pay for medications will be in advance as this is different depending on your insurance coverage. However, we may be able to find a substitute medication at lower cost or fill out paperwork to get insurance to cover a needed medication.   If a prior authorization is required to get your medication covered by your insurance company, please allow us 1-2 business days to complete this process.  Drug prices often vary depending on where the prescription is filled and some pharmacies may offer cheaper prices.  The website www.goodrx.com contains coupons for medications through different pharmacies. The prices here do not account for what the cost may be with help from insurance (it may be cheaper with your insurance), but the website can give you the price if you did not use any insurance.  - You can print the associated coupon and take it with your prescription to the pharmacy.  - You may also stop by our office during regular business hours and pick up a GoodRx coupon card.  - If you need your prescription sent electronically to a different pharmacy, notify our office through [place] MyChart or by phone at [phone] option 4.     Si Usted Necesita Algo Despus de Su Visita  Tambin puede enviarnos un mensaje a travs de MyChart. Por lo general respondemos a los mensajes de MyChart en el transcurso de 1 a 2 das hbiles.  Para renovar recetas, por favor pida a su farmacia que se ponga en contacto con nuestra oficina. Nuestro nmero de fax es el [phone].  Si tiene un asunto urgente cuando la clnica est cerrada y que no puede esperar hasta el siguiente da hbil, puede llamar/localizar a su doctor(a) al nmero que aparece a continuacin.   Por favor, tenga en cuenta que aunque hacemos todo lo posible para estar disponibles para asuntos urgentes fuera del horario de oficina, no estamos  disponibles las 24 horas del da, los 7 das de la semana.   Si tiene un problema urgente y no puede comunicarse con nosotros, puede optar por buscar atencin mdica  en el consultorio de su doctor(a), en una clnica privada, en un centro de atencin urgente o en una sala de emergencias.  Si tiene una emergencia mdica, por favor llame inmediatamente al 911 o vaya a la sala de emergencias.  Nmeros de bper  - Dr. Kowalski: [phone]  - Dra. Moye: [phone]  - Dra. Stewart: [phone]  En caso de inclemencias del tiempo, por favor llame a nuestra lnea principal al [phone] para una actualizacin sobre el estado de cualquier retraso o cierre.  Consejos para la medicacin en dermatologa: Por favor, guarde las cajas en las que vienen los medicamentos de uso tpico para ayudarle a seguir las instrucciones sobre dnde y cmo usarlos. Las farmacias generalmente imprimen las instrucciones del medicamento slo en   directamente en los tubos del medicamento.   Si su medicamento es muy caro, por favor, pngase en contacto con Zigmund Daniel llamando al [phone] y presione la opcin 4 o envenos un mensaje a travs de Pharmacist, community.   No podemos decirle cul ser su copago por los medicamentos por adelantado ya que esto es diferente dependiendo de la cobertura de su seguro. Sin embargo, es posible que podamos encontrar un medicamento sustituto a Electrical engineer un formulario para que el seguro cubra el medicamento que se considera necesario.   Si se requiere una autorizacin previa para que su compaa de seguros [place] su medicamento, por favor permtanos de 1 a 2 das hbiles para completar este proceso.  Los precios de los medicamentos varan con frecuencia dependiendo del Environmental consultant de dnde se surte la receta y alguna farmacias pueden ofrecer precios ms baratos.  El sitio web www.goodrx.com tiene cupones para medicamentos de Airline pilot. Los precios aqu no tienen en cuenta lo que  podra costar con la ayuda del seguro (puede ser ms barato con su seguro), pero el sitio web puede darle el precio si no utiliz Research scientist (physical sciences).  - Puede imprimir el cupn correspondiente y llevarlo con su receta a la farmacia.  - Tambin puede pasar por nuestra oficina durante el horario de atencin regular y Charity fundraiser una tarjeta de cupones de GoodRx.  - Si necesita que su receta se enve electrnicamente a una farmacia diferente, informe a nuestra oficina a travs de MyChart de [place] o por telfono llamando al [phone] y presione la opcin 4.

## 2021-11-11 LAB — ANATOMIC PATHOLOGY REPORT

## 2021-11-14 ENCOUNTER — Telehealth: Payer: Self-pay

## 2021-11-14 DIAGNOSIS — C4441 Basal cell carcinoma of skin of scalp and neck: Secondary | ICD-10-CM

## 2021-11-14 NOTE — Telephone Encounter (Signed)
-----   Message from Alfonso Patten, MD sent at 11/14/2021  1:21 PM EDT ----- Comment: Specimen A-Skin Biopsy, left postauricular: BASAL CELL  CARCINOMA, NODULAR TYPE, MARGINS INVOLVED, SEE COMMENT  --> Mohs with Dr. Lacinda Axon per Dr. Les Pou note.  MAs please call and refer. Thank you!

## 2021-11-14 NOTE — Telephone Encounter (Signed)
Discussed biopsy results with pt, referral sent to Dr Lacinda Axon per Davy Pique

## 2021-11-14 NOTE — Telephone Encounter (Signed)
Left pt msg to call for bx results.  Referral placed for Dr. Lacinda Axon for Mohs./sh

## 2022-01-15 ENCOUNTER — Encounter: Payer: Self-pay | Admitting: Dermatology

## 2022-01-25 ENCOUNTER — Telehealth: Payer: Self-pay

## 2022-01-25 NOTE — Telephone Encounter (Signed)
History and Specimen tracking updated per [facility] progress note.

## 2022-04-17 ENCOUNTER — Telehealth: Payer: Self-pay

## 2022-04-17 NOTE — Telephone Encounter (Signed)
Updated specimen tracking and history. aw

## 2022-05-08 ENCOUNTER — Ambulatory Visit: Payer: No Typology Code available for payment source | Admitting: Dermatology

## 2022-05-08 DIAGNOSIS — D485 Neoplasm of uncertain behavior of skin: Secondary | ICD-10-CM

## 2022-05-08 DIAGNOSIS — C44619 Basal cell carcinoma of skin of left upper limb, including shoulder: Secondary | ICD-10-CM | POA: Diagnosis not present

## 2022-05-08 DIAGNOSIS — D2262 Melanocytic nevi of left upper limb, including shoulder: Secondary | ICD-10-CM

## 2022-05-08 DIAGNOSIS — L578 Other skin changes due to chronic exposure to nonionizing radiation: Secondary | ICD-10-CM

## 2022-05-08 DIAGNOSIS — Z1283 Encounter for screening for malignant neoplasm of skin: Secondary | ICD-10-CM | POA: Diagnosis not present

## 2022-05-08 DIAGNOSIS — C4441 Basal cell carcinoma of skin of scalp and neck: Secondary | ICD-10-CM | POA: Diagnosis not present

## 2022-05-08 DIAGNOSIS — Z85828 Personal history of other malignant neoplasm of skin: Secondary | ICD-10-CM

## 2022-05-08 DIAGNOSIS — L570 Actinic keratosis: Secondary | ICD-10-CM

## 2022-05-08 DIAGNOSIS — D224 Melanocytic nevi of scalp and neck: Secondary | ICD-10-CM | POA: Diagnosis not present

## 2022-05-08 DIAGNOSIS — D489 Neoplasm of uncertain behavior, unspecified: Secondary | ICD-10-CM

## 2022-05-08 DIAGNOSIS — L814 Other melanin hyperpigmentation: Secondary | ICD-10-CM

## 2022-05-08 DIAGNOSIS — L821 Other seborrheic keratosis: Secondary | ICD-10-CM

## 2022-05-08 DIAGNOSIS — D229 Melanocytic nevi, unspecified: Secondary | ICD-10-CM

## 2022-05-08 HISTORY — DX: Actinic keratosis: L57.0

## 2022-05-08 NOTE — Patient Instructions (Addendum)
Electrodesiccation and Curettage ("Scrape and Burn") Wound Care Instructions  Leave the original bandage on for 24 hours if possible.  If the bandage becomes soaked or soiled before that time, it is OK to remove it and examine the wound.  A small amount of post-operative bleeding is normal.  If excessive bleeding occurs, remove the bandage, place gauze over the site and apply continuous pressure (no peeking) over the area for 30 minutes. If this does not work, please call our clinic as soon as possible or page your doctor if it is after hours.   Once a day, cleanse the wound with soap and water. It is fine to shower. If a thick crust develops you may use a Q-tip dipped into dilute hydrogen peroxide (mix 1:1 with water) to dissolve it.  Hydrogen peroxide can slow the healing process, so use it only as needed.    After washing, apply petroleum jelly (Vaseline) or an antibiotic ointment if your doctor prescribed one for you, followed by a bandage.    For best healing, the wound should be covered with a layer of ointment at all times. If you are not able to keep the area covered with a bandage to hold the ointment in place, this may mean re-applying the ointment several times a day.  Continue this wound care until the wound has healed and is no longer open. It may take several weeks for the wound to heal and close.  Itching and mild discomfort is normal during the healing process.  If you have any discomfort, you can take Tylenol (acetaminophen) or ibuprofen as directed on the bottle. (Please do not take these if you have an allergy to them or cannot take them for another reason).  Some redness, tenderness and white or yellow material in the wound is normal healing.  If the area becomes very sore and red, or develops a thick yellow-green material (pus), it may be infected; please notify us.    Wound healing continues for up to one year following surgery. It is not unusual to experience pain in the scar  from time to time during the interval.  If the pain becomes severe or the scar thickens, you should notify the office.    A slight amount of redness in a scar is expected for the first six months.  After six months, the redness will fade and the scar will soften and fade.  The color difference becomes less noticeable with time.  If there are any problems, return for a post-op surgery check at your earliest convenience.  To improve the appearance of the scar, you can use silicone scar gel, cream, or sheets (such as Mederma or Serica) every night for up to one year. These are available over the counter (without a prescription).  Please call our office at [phone] for any questions or concerns.     Biopsy Wound Care Instructions  Leave the original bandage on for 24 hours if possible.  If the bandage becomes soaked or soiled before that time, it is OK to remove it and examine the wound.  A small amount of post-operative bleeding is normal.  If excessive bleeding occurs, remove the bandage, place gauze over the site and apply continuous pressure (no peeking) over the area for 30 minutes. If this does not work, please call our clinic as soon as possible or page your doctor if it is after hours.   Once a day, cleanse the wound with soap and water. It is fine  to shower. If a thick crust develops you may use a Q-tip dipped into dilute hydrogen peroxide (mix 1:1 with water) to dissolve it.  Hydrogen peroxide can slow the healing process, so use it only as needed.    After washing, apply petroleum jelly (Vaseline) or an antibiotic ointment if your doctor prescribed one for you, followed by a bandage.    For best healing, the wound should be covered with a layer of ointment at all times. If you are not able to keep the area covered with a bandage to hold the ointment in place, this may mean re-applying the ointment several times a day.  Continue this wound care until the wound has healed and is no  longer open.   Itching and mild discomfort is normal during the healing process. However, if you develop pain or severe itching, please call our office.   If you have any discomfort, you can take Tylenol (acetaminophen) or ibuprofen as directed on the bottle. (Please do not take these if you have an allergy to them or cannot take them for another reason).  Some redness, tenderness and white or yellow material in the wound is normal healing.  If the area becomes very sore and red, or develops a thick yellow-green material (pus), it may be infected; please notify us.    If you have stitches, return to clinic as directed to have the stitches removed. You will continue wound care for 2-3 days after the stitches are removed.   Wound healing continues for up to one year following surgery. It is not unusual to experience pain in the scar from time to time during the interval.  If the pain becomes severe or the scar thickens, you should notify the office.    A slight amount of redness in a scar is expected for the first six months.  After six months, the redness will fade and the scar will soften and fade.  The color difference becomes less noticeable with time.  If there are any problems, return for a post-op surgery check at your earliest convenience.  To improve the appearance of the scar, you can use silicone scar gel, cream, or sheets (such as Mederma or Serica) every night for up to one year. These are available over the counter (without a prescription).  Please call our office at [phone] for any questions or concerns.      Photodynamic Therapy/Red Light Therapy  Actinic keratoses are the dry, red scaly spots on the skin caused by sun damage. A portion of these spots can turn into skin cancer with time, and treating them can help prevent development of skin cancer.   Treatment of these spots requires removal of the defective skin cells. There are various ways to remove actinic  keratoses, including freezing with liquid nitrogen, treatment with creams, or treatment with a blue light procedure in the office.   Photodynamic Therapy (PDT), also known as "Red light therapy" is an in office procedure used to treat actinic keratoses. It works by targeting precancerous cells. After treatment, these cells peel off and are replaced by healthy ones.   For your phototherapy appointment, you will have two appointments on the day of your treatment. The first appointment will be to apply a cream to the treatment area. You will leave this cream on for 1-2 hours depending on the area being treated. The second appointment will be to shine a blue light on the area for 16 minutes to kill off the precancer  cells. It is common to experience a burning sensation during the treatment.  After your treatment, it will be important to keep the treated areas of skin out of the sun completely for 48-72 hours (2-3 days) to prevent having a reaction.   Common side effects include: - Burning or stinging, which may be severe and can last up to 24-72 hours after your treatment - Scaling and crusting which may last up to 2 weeks - Redness, swelling and/or peeling which can last up to 4 weeks  To Care for Your Skin After PDT/Red Light Therapy: - Wash with soap, water and shampoo as normal. - If needed, you can use cold compresses (e.g. ice packs) for comfort - If okay with your primary care doctor, you may use analgesics such as acetaminophen (tylenol) every 4-6 hours, not to exceed recommended dose - You may apply Cerave Healing Ointment, Vaseline or Aquaphor as needed - If you have a lot of swelling you may take a Benadryl to help with this (this may cause drowsiness), not to exceed recommended dose. This may increase the risk of falls in people over 65 and may slow reaction time while driving, so it is not recommended to take before driving or operating machinery. - Sun Precautions - Wear a wide brim hat  for the next week if outside  - Wear a sunblock with zinc or titanium dioxide at least SPF 50 daily  If you have any questions or concerns, please call the office and ask to speak with a nurse.   --------------------------------------------------------------------------------------------------------------     Actinic keratoses are precancerous spots that appear secondary to cumulative UV radiation exposure/sun exposure over time. They are chronic with expected duration over 1 year. A portion of actinic keratoses will progress to squamous cell carcinoma of the skin. It is not possible to reliably predict which spots will progress to skin cancer and so treatment is recommended to prevent development of skin cancer.  Recommend daily broad spectrum sunscreen SPF 30+ to sun-exposed areas, reapply every 2 hours as needed.  Recommend staying in the shade or wearing long sleeves, sun glasses (UVA+UVB protection) and wide brim hats (4-inch brim around the entire circumference of the hat). Call for new or changing lesions.   Cryotherapy Aftercare  Wash gently with soap and water everyday.   Apply Vaseline and Band-Aid daily until healed.         Melanoma ABCDEs  Melanoma is the most dangerous type of skin cancer, and is the leading cause of death from skin disease.  You are more likely to develop melanoma if you: Have light-colored skin, light-colored eyes, or red or blond hair Spend a lot of time in the sun Tan regularly, either outdoors or in a tanning bed Have had blistering sunburns, especially during childhood Have a close family member who has had a melanoma Have atypical moles or large birthmarks  Early detection of melanoma is key since treatment is typically straightforward and cure rates are extremely high if we catch it early.   The first sign of melanoma is often a change in a mole or a new dark spot.  The ABCDE system is a way of remembering the signs of melanoma.  A for  asymmetry:  The two halves do not match. B for border:  The edges of the growth are irregular. C for color:  A mixture of colors are present instead of an even brown color. D for diameter:  Melanomas are usually (but not always) greater  than 78m - the size of a pencil eraser. E for evolution:  The spot keeps changing in size, shape, and color.  Please check your skin once per month between visits. You can use a small mirror in front and a large mirror behind you to keep an eye on the back side or your body.   If you see any new or changing lesions before your next follow-up, please call to schedule a visit.  Please continue daily skin protection including broad spectrum sunscreen SPF 30+ to sun-exposed areas, reapplying every 2 hours as needed when you're outdoors.   Staying in the shade or wearing long sleeves, sun glasses (UVA+UVB protection) and wide brim hats (4-inch brim around the entire circumference of the hat) are also recommended for sun protection.    Due to recent changes in healthcare laws, you may see results of your pathology and/or laboratory studies on MyChart before the doctors have had a chance to review them. We understand that in some cases there may be results that are confusing or concerning to you. Please understand that not all results are received at the same time and often the doctors may need to interpret multiple results in order to provide you with the best plan of care or course of treatment. Therefore, we ask that you please give u[place]2 business days to thoroughly review all your results before contacting the office for clarification. Should we see a critical lab result, you will be contacted sooner.   If You Need Anything After Your Visit  If you have any questions or concerns for your doctor, please call our main line at [phone] press option 4 to reach your doctor's medical assistant. If no one answers, please leave a voicemail as directed and we will  return your call as soon as possible. Messages left after 4 pm will be answered the following business day.   You may also send [place] message via [place] We typically respond to MyChart messages within 1-2 business days.  For prescription refills, please ask your pharmacy to contact our office. Our fax number is [phone]  If you have an urgent issue when the clinic is closed that cannot wait until the next business day, you can page your doctor at the number below.    Please note that while we do our best to be available for urgent issues outside of office hours, we are not available 24/7.   If you have an urgent issue and are unable to reach u[place] you may choose to seek medical care at your doctor's office, retail clinic, urgent care center, or emergency room.  If you have a medical emergency, please immediately call 911 or go to the emergency department.  Pager Numbers  - Dr. KNehemiah Massed [phone] - Dr. MLaurence Ferrari [phone] - Dr. SNicole Kindred [phone] In the event of inclement weather, please call our main line at [phone] an update on the status of any delays or closures.  Dermatology Medication Tips: Please keep the boxes that topical medications come in in order to help keep track of the instructions about where and how to use these. Pharmacies typically print the medication instructions only on the boxes and not directly on the medication tubes.   If your medication is too expensive, please contact our office at [phone] 4 or send [place] message through [place]   We are unable to tell what your co-pay for medications will be in advance as this is different  depending on your insurance coverage. However, we may be able to find a substitute medication at lower cost or fill out paperwork to get insurance to cover a needed medication.   If a prior authorization is required to get your medication covered by your insurance company, please allow [place] 1-2 business  days to complete this process.  Drug prices often vary depending on where the prescription is filled and some pharmacies may offer cheaper prices.  The website www.goodrx.com contains coupons for medications through different pharmacies. The prices here do not account for what the cost may be with help from insurance (it may be cheaper with your insurance), but the website can give you the price if you did not use any insurance.  - You can print the associated coupon and take it with your prescription to the pharmacy.  - You may also stop by our office during regular business hours and pick up a GoodRx coupon card.  - If you need your prescription sent electronically to a different pharmacy, notify our office through [facility] or by phone at [phone] option 4.     Si Usted Necesita Algo Despus de Su Visita  Tambin puede enviarnos un mensaje a travs de Pharmacist, community. Por lo general respondemos a los mensajes de MyChart en el transcurso de 1 a 2 das hbiles.  Para renovar recetas, por favor pida a su farmacia que se ponga en contacto con nuestra oficina. Harland Dingwall de fax es [place] [phone].  Si tiene un asunto urgente cuando la clnica est cerrada y que no puede esperar hasta el siguiente da hbil, puede llamar/localizar a su doctor(a) al nmero que aparece a continuacin.   Por favor, tenga en cuenta que aunque hacemos todo lo posible para estar disponibles para asuntos urgentes fuera del horario de [place], no estamos disponibles las 24 horas del da, los 7 das de la [place].   Si tiene un problema urgente y no puede comunicarse con nosotros, puede optar por buscar atencin mdica  en el consultorio de su doctor(a), en una clnica privada, en un centro de atencin urgente o en una sala de emergencias.  Si tiene Engineering geologist, por favor llame inmediatamente al 911 o vaya a la sala de emergencias.  Nmeros de bper  - Dr. Nehemiah Massed: [phone]  - Dra. Moye:  [phone]  - Dra. Nicole Kindred: [phone]  En caso de inclemencias del [place], por favor llame a Johnsie Kindred principal al [phone] para una actualizacin sobre el [place] de cualquier retraso o cierre.  Consejos para la medicacin en dermatologa: Por favor, guarde las cajas en las que vienen los medicamentos de uso tpico para ayudarle a seguir las instrucciones sobre dnde y cmo usarlos. Las farmacias generalmente imprimen las instrucciones del medicamento slo en las cajas y no directamente en los tubos del [place].   Si su medicamento es muy caro, por favor, pngase en contacto con Zigmund Daniel llamando al [phone] y presione la opcin 4 o envenos un mensaje a travs de Pharmacist, community.   No podemos decirle cul ser su copago por los medicamentos por adelantado ya que esto es diferente dependiendo de la cobertura de su seguro. Sin embargo, es posible que podamos encontrar un medicamento sustituto a Electrical engineer un formulario para que el seguro cubra el medicamento que se considera necesario.   Si se requiere una autorizacin previa para que su compaa de seguros [place] su medicamento, por favor permtanos de 1 a 2 das hbiles para [employer]  proceso.  Los precios de los medicamentos varan con frecuencia dependiendo del Environmental consultant de dnde se surte la receta y alguna farmacias pueden ofrecer precios ms baratos.  El sitio web www.goodrx.com tiene cupones para medicamentos de Airline pilot. Los precios aqu no tienen en cuenta lo que podra costar con la ayuda del seguro (puede ser ms barato con su seguro), pero el sitio web puede darle el precio si no utiliz Research scientist (physical sciences).  - Puede imprimir el cupn correspondiente y llevarlo con su receta a la farmacia.  - Tambin puede pasar por nuestra oficina durante el horario de atencin regular y Charity fundraiser una tarjeta de cupones de GoodRx.  - Si necesita que su receta se enve electrnicamente a una farmacia diferente,  informe a nuestra oficina a travs de MyChart de [place] o por telfono llamando al [phone] y presione la opcin 4.

## 2022-05-08 NOTE — Progress Notes (Signed)
Follow-Up Visit   Subjective  Cory Navarro is a 62 y.o. male who presents for the following: Annual Exam (6 month tbse, spot at right cheek has been treated with cryotherapy came back, had mohs surgery for [facility] behind left ear/Dr. Lacinda Axon wanted birthmark looked at on left scalp/).  The patient presents for Total-Body Skin Exam (TBSE) for skin cancer screening and mole check.  The patient has spots, moles and lesions to be evaluated, some may be new or changing and the patient has concerns that these could be cancer.  He reports scaly spots on forehead and L arm.   The following portions of the chart were reviewed this encounter and updated as appropriate:      Review of Systems: No other skin or systemic complaints except as noted in HPI or Assessment and Plan.   Objective  Well appearing patient in no apparent distress; mood and affect are within normal limits.  A full examination was performed including scalp, head, eyes, ears, nose, lips, neck, chest, axillae, abdomen, back, buttocks, bilateral upper extremities, bilateral lower extremities, hands, feet, fingers, toes, fingernails, and toenails. All findings within normal limits unless otherwise noted below.  left parietal scalp 1.5 x 1 cm pink yellow lobulated plaque     right lower cheek 5 mm keratotic papule      left lower shoulder 1 x 0.6 cm pink scaly macule      left frontal scalp at hairline x 1, scalp x 5, left nasal dorsum x 1, left sideburn area x 1, right postauricular x 2, left forearm x 8, left forehead scalp x 7, right cheek x 1 (26) Erythematous thin papules/macules with gritty scale.   left sternum 5 mm flesh papule    Assessment & Plan  Neoplasm of uncertain behavior (3) left parietal scalp  Epidermal / dermal shaving  Lesion diameter (cm):  2 Informed consent: discussed and consent obtained   Patient was prepped and draped in usual sterile fashion: Area prepped with alcohol. Anesthesia:  the lesion was anesthetized in a standard fashion   Anesthetic:  1% lidocaine w/ epinephrine 1-100,000 buffered w/ 8.4% NaHCO3 Instrument used: DermaBlade and flexible razor blade   Hemostasis achieved with: pressure, aluminum chloride and electrodesiccation   Outcome: patient tolerated procedure well   Post-procedure details: wound care instructions given   Post-procedure details comment:  Ointment and small bandage applied.   Specimen 1 - Surgical pathology Differential Diagnosis: sebaceous nevus r/o bcc   Check Margins: No  right lower cheek  Epidermal / dermal shaving  Lesion diameter (cm):  0.5 Informed consent: discussed and consent obtained   Patient was prepped and draped in usual sterile fashion: Area prepped with alcohol. Anesthesia: the lesion was anesthetized in a standard fashion   Anesthetic:  1% lidocaine w/ epinephrine 1-100,000 buffered w/ 8.4% NaHCO3 Instrument used: flexible razor blade   Hemostasis achieved with: pressure, aluminum chloride and electrodesiccation   Outcome: patient tolerated procedure well   Post-procedure details: wound care instructions given   Post-procedure details comment:  Ointment and small bandage applied.  Additional details:     Specimen 2 - Surgical pathology Differential Diagnosis: R/o hypertrophic ak vs scc   Check Margins: No  left lower shoulder  Epidermal / dermal shaving  Lesion diameter (cm):  1 Informed consent: discussed and consent obtained   Patient was prepped and draped in usual sterile fashion: Area prepped with alcohol. Anesthesia: the lesion was anesthetized in a standard fashion   Anesthetic:  1% lidocaine w/ epinephrine 1-100,000 buffered w/ 8.4% NaHCO3 Instrument used: flexible razor blade   Hemostasis achieved with: pressure, aluminum chloride and electrodesiccation   Outcome: patient tolerated procedure well    Destruction of lesion  Destruction method: electrodesiccation and curettage   Informed  consent: discussed and consent obtained   Curettage performed in three different directions: Yes   Electrodesiccation performed over the curetted area: Yes   Final wound size (cm):  1.3 Hemostasis achieved with:  pressure, aluminum chloride and electrodesiccation Outcome: patient tolerated procedure well with no complications   Post-procedure details: wound care instructions given   Additional details:  Mupirocin ointment and Bandaid applied   Specimen 3 - Surgical pathology Differential Diagnosis: R/o superficial bcc  Check Margins: No  R/o sebaceous nevus vs BCC L parietal scalp  R/o hypertrophic ak vs scc right lower cheek  R/o AK vrs superficial bcc  left lower shoulder    Actinic keratosis (26) left frontal scalp at hairline x 1, scalp x 5, left nasal dorsum x 1, left sideburn area x 1, right postauricular x 2, left forearm x 8, left forehead scalp x 7, right cheek x 1  Recommend - Will schedule red light photodynamic therapy to the face  Actinic keratoses are precancerous spots that appear secondary to cumulative UV radiation exposure/sun exposure over time. They are chronic with expected duration over 1 year. A portion of actinic keratoses will progress to squamous cell carcinoma of the skin. It is not possible to reliably predict which spots will progress to skin cancer and so treatment is recommended to prevent development of skin cancer.  Recommend daily broad spectrum sunscreen SPF 30+ to sun-exposed areas, reapply every 2 hours as needed.  Recommend staying in the shade or wearing long sleeves, sun glasses (UVA+UVB protection) and wide brim hats (4-inch brim around the entire circumference of the hat). Call for new or changing lesions.  Destruction of lesion - left frontal scalp at hairline x 1, scalp x 5, left nasal dorsum x 1, left sideburn area x 1, right postauricular x 2, left forearm x 8, left forehead scalp x 7, right cheek x 1  Destruction method: cryotherapy    Informed consent: discussed and consent obtained   Lesion destroyed using liquid nitrogen: Yes   Region frozen until ice ball extended beyond lesion: Yes   Outcome: patient tolerated procedure well with no complications   Post-procedure details: wound care instructions given   Additional details:  Prior to procedure, discussed risks of blister formation, small wound, skin dyspigmentation, or rare scar following cryotherapy. Recommend Vaseline ointment to treated areas while healing.   Nevus left sternum  Benign-appearing.  Observation.  Call clinic for new or changing lesions.  Recommend daily use of broad spectrum spf 30+ sunscreen to sun-exposed areas.    Lentigines - Scattered tan macules - Due to sun exposure - Benign-appearing, observe - Recommend daily broad spectrum sunscreen SPF 30+ to sun-exposed areas, reapply every 2 hours as needed. - Call for any changes  Seborrheic Keratoses - Stuck-on, waxy, tan-brown papules and/or plaques  - Benign-appearing - Discussed benign etiology and prognosis. - Observe - Call for any changes  Melanocytic Nevi - Tan-brown and/or pink-flesh-colored symmetric macules and papules - Benign appearing on exam today - Observation - Call clinic for new or changing moles - Recommend daily use of broad spectrum spf 30+ sunscreen to sun-exposed areas.   Hemangiomas - Red papules - Discussed benign nature - Observe - Call for any changes  Actinic Damage with PreCancerous Actinic Keratoses Counseling for Topical Chemotherapy Management: Patient exhibits: - Severe, confluent actinic changes with pre-cancerous actinic keratoses that is secondary to cumulative UV radiation exposure over time - Condition that is severe; chronic, not at goal. - diffuse scaly erythematous macules and papules with underlying dyspigmentation - Discussed Prescription "Field Treatment" topical Chemotherapy for Severe, Chronic Confluent Actinic Changes with  Pre-Cancerous Actinic Keratoses Field treatment involves treatment of an entire area of skin that has confluent Actinic Changes (Sun/ Ultraviolet light damage) and PreCancerous Actinic Keratoses by method of PhotoDynamic Therapy (PDT) and/or prescription Topical Chemotherapy agents such as 5-fluorouracil, 5-fluorouracil/calcipotriene, and/or imiquimod.  The purpose is to decrease the number of clinically evident and subclinical PreCancerous lesions to prevent progression to development of skin cancer by chemically destroying early precancer changes that may or may not be visible.  It has been shown to reduce the risk of developing skin cancer in the treated area. As a result of treatment, redness, scaling, crusting, and open sores may occur during treatment course. One or more than one of these methods may be used and may have to be used several times to control, suppress and eliminate the PreCancerous changes. Discussed treatment course, expected reaction, and possible side effects. - Recommend daily broad spectrum sunscreen SPF 30+ to sun-exposed areas, reapply every 2 hours as needed.  - Staying in the shade or wearing long sleeves, sun glasses (UVA+UVB protection) and wide brim hats (4-inch brim around the entire circumference of the hat) are also recommended. - Call for new or changing lesions.  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  - Call if any new or changing lesions are noted between office visits   History of Melanoma 2006 left occipital hairline - No evidence of recurrence today - Recommend regular full body skin exams - Recommend daily broad spectrum sunscreen SPF 30+ to sun-exposed areas, reapply  every 2 hours as needed.  - Call if any new or changing lesions are noted between office visits  Skin cancer screening performed today. Return in about 6 months (around 11/06/2022) for tbse hx of bcc hx of  melanoma. I, Ruthell Rummage, CMA, am acting as scribe for Brendolyn Patty, MD.  Documentation: I have reviewed the above documentation for accuracy and completeness, and I agree with the above.  Brendolyn Patty MD

## 2022-05-14 ENCOUNTER — Telehealth: Payer: Self-pay

## 2022-05-14 NOTE — Telephone Encounter (Signed)
Tried calling patient no answer and no vm./sh

## 2022-05-14 NOTE — Telephone Encounter (Signed)
-----  Message from Brendolyn Patty, MD sent at 05/14/2022  1:46 PM EST ----- 1. Skin , left parietal scalp BASAL CELL CARCINOMA, NODULAR AND INFILTRATIVE PATTERNS SUPERIMPOSED ON NEVUS SEBACEUS OF [place] 2. Skin , right lower cheek ACTINIC KERATOSIS AND SEBORRHEIC KERATOSIS 3. Skin , left lower shoulder SUPERFICIAL BASAL CELL CARCINOMA  1. BCC skin cancer on Sebaceous Nevus- needs Mohs surgery with Dr. Lacinda Axon 2. Precancer, cryotherapy if it recurs - please call patient 3. BCC skin cancer- already treated with EDC at time of biopsy

## 2022-05-15 NOTE — Telephone Encounter (Signed)
Tried calling patient again and no answer or vm.  Sent patient email advising to call office for bx results/sh

## 2022-05-16 ENCOUNTER — Telehealth: Payer: Self-pay

## 2022-05-16 DIAGNOSIS — C4441 Basal cell carcinoma of skin of scalp and neck: Secondary | ICD-10-CM

## 2022-05-16 NOTE — Telephone Encounter (Signed)
Patient advised of BX results and referral sent to Dr. Jonathon Jordan office. aw

## 2022-05-16 NOTE — Telephone Encounter (Signed)
-----  Message from Brendolyn Patty, MD sent at 05/14/2022  1:46 PM EST ----- 1. Skin , left parietal scalp BASAL CELL CARCINOMA, NODULAR AND INFILTRATIVE PATTERNS SUPERIMPOSED ON NEVUS SEBACEUS OF [place] 2. Skin , right lower cheek ACTINIC KERATOSIS AND SEBORRHEIC KERATOSIS 3. Skin , left lower shoulder SUPERFICIAL BASAL CELL CARCINOMA  1. BCC skin cancer on Sebaceous Nevus- needs Mohs surgery with Dr. Lacinda Axon 2. Precancer, cryotherapy if it recurs - please call patient 3. BCC skin cancer- already treated with EDC at time of biopsy

## 2022-07-31 ENCOUNTER — Ambulatory Visit: Payer: No Typology Code available for payment source

## 2022-08-14 ENCOUNTER — Telehealth: Payer: Self-pay

## 2022-08-14 NOTE — Telephone Encounter (Signed)
Progress notes from [facility] surgery. Updated specimen tracking and history. aw

## 2022-08-28 ENCOUNTER — Ambulatory Visit: Payer: No Typology Code available for payment source

## 2022-10-16 ENCOUNTER — Encounter: Payer: Self-pay | Admitting: Dermatology

## 2022-10-16 ENCOUNTER — Ambulatory Visit: Payer: No Typology Code available for payment source | Admitting: Dermatology

## 2022-10-16 VITALS — BP 148/96 | HR 76

## 2022-10-16 DIAGNOSIS — D492 Neoplasm of unspecified behavior of bone, soft tissue, and skin: Secondary | ICD-10-CM

## 2022-10-16 DIAGNOSIS — D225 Melanocytic nevi of trunk: Secondary | ICD-10-CM

## 2022-10-16 DIAGNOSIS — W908XXA Exposure to other nonionizing radiation, initial encounter: Secondary | ICD-10-CM | POA: Diagnosis not present

## 2022-10-16 DIAGNOSIS — C4441 Basal cell carcinoma of skin of scalp and neck: Secondary | ICD-10-CM

## 2022-10-16 DIAGNOSIS — L570 Actinic keratosis: Secondary | ICD-10-CM

## 2022-10-16 DIAGNOSIS — Z1283 Encounter for screening for malignant neoplasm of skin: Secondary | ICD-10-CM | POA: Diagnosis not present

## 2022-10-16 DIAGNOSIS — L820 Inflamed seborrheic keratosis: Secondary | ICD-10-CM | POA: Diagnosis not present

## 2022-10-16 DIAGNOSIS — Z7189 Other specified counseling: Secondary | ICD-10-CM

## 2022-10-16 DIAGNOSIS — L578 Other skin changes due to chronic exposure to nonionizing radiation: Secondary | ICD-10-CM | POA: Diagnosis not present

## 2022-10-16 DIAGNOSIS — Z85828 Personal history of other malignant neoplasm of skin: Secondary | ICD-10-CM

## 2022-10-16 DIAGNOSIS — D229 Melanocytic nevi, unspecified: Secondary | ICD-10-CM

## 2022-10-16 DIAGNOSIS — L814 Other melanin hyperpigmentation: Secondary | ICD-10-CM

## 2022-10-16 DIAGNOSIS — Z85820 Personal history of malignant melanoma of skin: Secondary | ICD-10-CM

## 2022-10-16 DIAGNOSIS — L821 Other seborrheic keratosis: Secondary | ICD-10-CM

## 2022-10-16 NOTE — Progress Notes (Signed)
Follow-Up Visit   Subjective  Cory Navarro is a 62 y.o. male who presents for the following: Skin Cancer Screening and Full Body Skin Exam. Hx of MM. Hx of multiple BCCs.   The patient presents for Total-Body Skin Exam (TBSE) for skin cancer screening and mole check. The patient has spots, moles and lesions to be evaluated, some may be new or changing and the patient has concerns that these could be cancer.    The following portions of the chart were reviewed this encounter and updated as appropriate: medications, allergies, medical history  Review of Systems:  No other skin or systemic complaints except as noted in HPI or Assessment and Plan.  Objective  Well appearing patient in no apparent distress; mood and affect are within normal limits.  A full examination was performed including scalp, head, eyes, ears, nose, lips, neck, chest, axillae, abdomen, back, buttocks, bilateral upper extremities, bilateral lower extremities, hands, feet, fingers, toes, fingernails, and toenails. All findings within normal limits unless otherwise noted below.   Relevant physical exam findings are noted in the Assessment and Plan.  Posterior neck 6 mm pink crusted macule       Right Shoulder - Posterior x1, left postauricular scalp x1 (2nd treatment) (2) Erythematous keratotic or waxy stuck-on papule   Vertex Scalp x6, left mandible x1, nasal tip x1, left preauricular x1, R forehead x1, L ant shoulder x1 (11) Erythematous thin papules/macules with gritty scale.     Assessment & Plan   HISTORY OF BASAL CELL CARCINOMA OF THE SKIN. Multiple sites, see history. Most recent at L parietal scalp, Tx w/Mohs surgery. Left lower shoulder Tx with [facility] 05/08/2022. - No evidence of recurrence today - Recommend regular full body skin exams - Recommend daily broad spectrum sunscreen SPF 30+ to sun-exposed areas, reapply every 2 hours as needed.  - Call if any new or changing lesions are noted between  office visits  HISTORY OF MELANOMA. 2006. Left occipital hair line.  - No evidence of recurrence today - Recommend regular full body skin exams - Recommend daily broad spectrum sunscreen SPF 30+ to sun-exposed areas, reapply every 2 hours as needed.  - Call if any new or changing lesions are noted between office visits   LENTIGINES, SEBORRHEIC KERATOSES, HEMANGIOMAS - Benign normal skin lesions - Benign-appearing - Call for any changes  MELANOCYTIC NEVI - Tan-brown and/or pink-flesh-colored symmetric macules and papules - Benign appearing on exam today - Observation - Call clinic for new or changing moles - Recommend daily use of broad spectrum spf 30+ sunscreen to sun-exposed areas.  NEVUS Exam:  5 mm flesh papule left sternum  Treatment Plan: Benign appearing on exam today. Recommend observation. Call clinic for new or changing moles. Recommend daily use of broad spectrum spf 30+ sunscreen to sun-exposed areas.  ACTINIC DAMAGE WITH PRECANCEROUS ACTINIC KERATOSES Counseling for Topical Chemotherapy Management: Patient exhibits: - Severe, confluent actinic changes with pre-cancerous actinic keratoses that is secondary to cumulative UV radiation exposure over time - Condition that is severe; chronic, not at goal. - diffuse scaly erythematous macules and papules with underlying dyspigmentation - Discussed Prescription "Field Treatment" topical Chemotherapy for Severe, Chronic Confluent Actinic Changes with Pre-Cancerous Actinic Keratoses Field treatment involves treatment of an entire area of skin that has confluent Actinic Changes (Sun/ Ultraviolet light damage) and PreCancerous Actinic Keratoses by method of PhotoDynamic Therapy (PDT) and/or prescription Topical Chemotherapy agents such as 5-fluorouracil, 5-fluorouracil/calcipotriene, and/or imiquimod.  The purpose is to decrease the number  of clinically evident and subclinical PreCancerous lesions to prevent progression to development  of skin cancer by chemically destroying early precancer changes that may or may not be visible.  It has been shown to reduce the risk of developing skin cancer in the treated area. As a result of treatment, redness, scaling, crusting, and open sores may occur during treatment course. One or more than one of these methods may be used and may have to be used several times to control, suppress and eliminate the PreCancerous changes. Discussed treatment course, expected reaction, and possible side effects. - Recommend daily broad spectrum sunscreen SPF 30+ to sun-exposed areas, reapply every 2 hours as needed.  - Staying in the shade or wearing long sleeves, sun glasses (UVA+UVB protection) and wide brim hats (4-inch brim around the entire circumference of the hat) are also recommended. - Call for new or changing lesions.  - Plan red light with debridement to face/scalp this fall.   SKIN CANCER SCREENING PERFORMED TODAY.    Neoplasm of skin Posterior neck  Epidermal / dermal shaving  Lesion diameter (cm):  0.6 Informed consent: discussed and consent obtained   Patient was prepped and draped in usual sterile fashion: Area prepped with alcohol. Anesthesia: the lesion was anesthetized in a standard fashion   Anesthetic:  1% lidocaine w/ epinephrine 1-100,000 buffered w/ 8.4% NaHCO3 Instrument used: flexible razor blade   Hemostasis achieved with: pressure, aluminum chloride and electrodesiccation   Outcome: patient tolerated procedure well    Destruction of lesion  Destruction method: electrodesiccation and curettage   Informed consent: discussed and consent obtained   Curettage performed in three different directions: Yes   Electrodesiccation performed over the curetted area: Yes   Final wound size (cm):  0.9 Hemostasis achieved with:  pressure, aluminum chloride and electrodesiccation Outcome: patient tolerated procedure well with no complications   Post-procedure details: wound care  instructions given   Additional details:  Mupirocin ointment and Bandaid applied   Specimen 1 - Surgical pathology Differential Diagnosis: AK, R/O BCC  Check Margins: No  Inflamed seborrheic keratosis (2) Right Shoulder - Posterior x1, left postauricular scalp x1 (2nd treatment)  Symptomatic, irritating, patient would like treated.  Destruction of lesion - Right Shoulder - Posterior x1, left postauricular scalp x1 (2nd treatment)  Destruction method: cryotherapy   Informed consent: discussed and consent obtained   Lesion destroyed using liquid nitrogen: Yes   Region frozen until ice ball extended beyond lesion: Yes   Outcome: patient tolerated procedure well with no complications   Post-procedure details: wound care instructions given   Additional details:  Prior to procedure, discussed risks of blister formation, small wound, skin dyspigmentation, or rare scar following cryotherapy. Recommend Vaseline ointment to treated areas while healing.   AK (actinic keratosis) (11) Vertex Scalp x6, left mandible x1, nasal tip x1, left preauricular x1, R forehead x1, L ant shoulder x1  Actinic keratoses are precancerous spots that appear secondary to cumulative UV radiation exposure/sun exposure over time. They are chronic with expected duration over 1 year. A portion of actinic keratoses will progress to squamous cell carcinoma of the skin. It is not possible to reliably predict which spots will progress to skin cancer and so treatment is recommended to prevent development of skin cancer.  Recommend daily broad spectrum sunscreen SPF 30+ to sun-exposed areas, reapply every 2 hours as needed.  Recommend staying in the shade or wearing long sleeves, sun glasses (UVA+UVB protection) and wide brim hats (4-inch brim around the entire  circumference of the hat). Call for new or changing lesions.  Destruction of lesion - Vertex Scalp x6, left mandible x1, nasal tip x1, left preauricular x1, R forehead  x1, L ant shoulder x1  Destruction method: cryotherapy   Informed consent: discussed and consent obtained   Lesion destroyed using liquid nitrogen: Yes   Region frozen until ice ball extended beyond lesion: Yes   Outcome: patient tolerated procedure well with no complications   Post-procedure details: wound care instructions given   Additional details:  Prior to procedure, discussed risks of blister formation, small wound, skin dyspigmentation, or rare scar following cryotherapy. Recommend Vaseline ointment to treated areas while healing.    Return in about 6 months (around 04/17/2023) for TBSE, HxMM, HxBCC, PDT/Red Light with debridement this fall to face, then scalp.  I, Lawson Radar, CMA, am acting as scribe for Willeen Niece, MD.   Documentation: I have reviewed the above documentation for accuracy and completeness, and I agree with the above.  Willeen Niece, MD

## 2022-10-16 NOTE — Patient Instructions (Addendum)
Cryotherapy Aftercare  Wash gently with soap and water everyday.   Apply Vaseline daily until healed.   Wound Care Instructions  Cleanse wound gently with soap and water once a day then pat dry with clean gauze. Apply a thin coat of Petrolatum (petroleum jelly, "Vaseline") over the wound (unless you have an allergy to this). We recommend that you use a new, sterile tube of Vaseline. Do not pick or remove scabs. Do not remove the yellow or white "healing tissue" from the base of the wound.  Cover the wound with fresh, clean, nonstick gauze and secure with paper tape. You may use Band-Aids in place of gauze and tape if the wound is small enough, but would recommend trimming much of the tape off as there is often too much. Sometimes Band-Aids can irritate the skin.  You should call the office for your biopsy report after 1 week if you have not already been contacted.  If you experience any problems, such as abnormal amounts of bleeding, swelling, significant bruising, significant pain, or evidence of infection, please call the office immediately.  FOR ADULT SURGERY PATIENTS: If you need something for pain relief you may take 1 extra strength Tylenol (acetaminophen) AND 2 Ibuprofen (200mg each) together every 4 hours as needed for pain. (do not take these if you are allergic to them or if you have a reason you should not take them.) Typically, you may only need pain medication for 1 to 3 days.      Recommend daily broad spectrum sunscreen SPF 30+ to sun-exposed areas, reapply every 2 hours as needed. Call for new or changing lesions.  Staying in the shade or wearing long sleeves, sun glasses (UVA+UVB protection) and wide brim hats (4-inch brim around the entire circumference of the hat) are also recommended for sun protection.     Melanoma ABCDEs  Melanoma is the most dangerous type of skin cancer, and is the leading cause of death from skin disease.  You are more likely to develop melanoma if  you: Have light-colored skin, light-colored eyes, or red or blond hair Spend a lot of time in the sun Tan regularly, either outdoors or in a tanning bed Have had blistering sunburns, especially during childhood Have a close family member who has had a melanoma Have atypical moles or large birthmarks  Early detection of melanoma is key since treatment is typically straightforward and cure rates are extremely high if we catch it early.   The first sign of melanoma is often a change in a mole or a new dark spot.  The ABCDE system is a way of remembering the signs of melanoma.  A for asymmetry:  The two halves do not match. B for border:  The edges of the growth are irregular. C for color:  A mixture of colors are present instead of an even brown color. D for diameter:  Melanomas are usually (but not always) greater than 6mm - the size of a pencil eraser. E for evolution:  The spot keeps changing in size, shape, and color.  Please check your skin once per month between visits. You can use a small mirror in front and a large mirror behind you to keep an eye on the back side or your body.   If you see any new or changing lesions before your next follow-up, please call to schedule a visit.  Please continue daily skin protection including broad spectrum sunscreen SPF 30+ to sun-exposed areas, reapplying every 2 hours as   needed when you're outdoors.   Staying in the shade or wearing long sleeves, sun glasses (UVA+UVB protection) and wide brim hats (4-inch brim around the entire circumference of the hat) are also recommended for sun protection.     Due to recent changes in healthcare laws, you may see results of your pathology and/or laboratory studies on MyChart before the doctors have had a chance to review them. We understand that in some cases there may be results that are confusing or concerning to you. Please understand that not all results are received at the same time and often the doctors  may need to interpret multiple results in order to provide you with the best plan of care or course of treatment. Therefore, we ask that you please give us 2 business days to thoroughly review all your results before contacting the office for clarification. Should we see a critical lab result, you will be contacted sooner.   If You Need Anything After Your Visit  If you have any questions or concerns for your doctor, please call our main line at [phone] and press option 4 to reach your doctor's medical assistant. If no one answers, please leave a voicemail as directed and we will return your call as soon as possible. Messages left after 4 pm will be answered the following business day.   You may also send us a message via MyChart. We typically respond to MyChart messages within 1-2 business days.  For prescription refills, please ask your pharmacy to contact our office. Our fax number is [phone].  If you have an urgent issue when the clinic is closed that cannot wait until the next business day, you can page your doctor at the number below.    Please note that while we do our best to be available for urgent issues outside of office hours, we are not available 24/7.   If you have an urgent issue and are unable to reach us, you may choose to seek medical care at your doctor's office, retail clinic, urgent care center, or emergency room.  If you have a medical emergency, please immediately call 911 or go to the emergency department.  Pager Numbers  - Dr. Kowalski: [phone]  - Dr. Moye: [phone]  - Dr. Stewart: [phone]  In the event of inclement weather, please call our main line at [phone] for an update on the status of any delays or closures.  Dermatology Medication Tips: Please keep the boxes that topical medications come in in order to help keep track of the instructions about where and how to use these. Pharmacies typically print the medication instructions  only on the boxes and not directly on the medication tubes.   If your medication is too expensive, please contact our office at [phone] option 4 or send us a message through MyChart.   We are unable to tell what your co-pay for medications will be in advance as this is different depending on your insurance coverage. However, we may be able to find a substitute medication at lower cost or fill out paperwork to get insurance to cover a needed medication.   If a prior authorization is required to get your medication covered by your insurance company, please allow us 1-2 business days to complete this process.  Drug prices often vary depending on where the prescription is filled and some pharmacies may offer cheaper prices.  The website www.goodrx.com contains coupons for medications through different pharmacies. The prices here do not account for what   the cost may be with help from insurance (it may be cheaper with your insurance), but the website can give you the price if you did not use any insurance.  - You can print the associated coupon and take it with your prescription to the pharmacy.  - You may also stop by our office during regular business hours and pick up a GoodRx coupon card.  - If you need your prescription sent electronically to a different pharmacy, notify our office through [facility] MyChart or by phone at [phone] option 4.     Si Usted Necesita Algo Despus de Su Visita  Tambin puede enviarnos un mensaje a travs de MyChart. Por lo general respondemos a los mensajes de MyChart en el transcurso de 1 a 2 das hbiles.  Para renovar recetas, por favor pida a su farmacia que se ponga en contacto con nuestra oficina. Nuestro nmero de fax es el [phone].  Si tiene un asunto urgente cuando la clnica est cerrada y que no puede esperar hasta el siguiente da hbil, puede llamar/localizar a su doctor(a) al nmero que aparece a continuacin.   Por favor, tenga en  cuenta que aunque hacemos todo lo posible para estar disponibles para asuntos urgentes fuera del horario de oficina, no estamos disponibles las 24 horas del da, los 7 das de la semana.   Si tiene un problema urgente y no puede comunicarse con nosotros, puede optar por buscar atencin mdica  en el consultorio de su doctor(a), en una clnica privada, en un centro de atencin urgente o en una sala de emergencias.  Si tiene una emergencia mdica, por favor llame inmediatamente al 911 o vaya a la sala de emergencias.  Nmeros de bper  - Dr. Kowalski: [phone]  - Dra. Moye: [phone]  - Dra. Stewart: [phone]  En caso de inclemencias del tiempo, por favor llame a nuestra lnea principal al [phone] para una actualizacin sobre el estado de cualquier retraso o cierre.  Consejos para la medicacin en dermatologa: Por favor, guarde las cajas en las que vienen los medicamentos de uso tpico para ayudarle a seguir las instrucciones sobre dnde y cmo usarlos. Las farmacias generalmente imprimen las instrucciones del medicamento slo en las cajas y no directamente en los tubos del medicamento.   Si su medicamento es muy caro, por favor, pngase en contacto con nuestra oficina llamando al [phone] y presione la opcin 4 o envenos un mensaje a travs de MyChart.   No podemos decirle cul ser su copago por los medicamentos por adelantado ya que esto es diferente dependiendo de la cobertura de su seguro. Sin embargo, es posible que podamos encontrar un medicamento sustituto a menor costo o llenar un formulario para que el seguro cubra el medicamento que se considera necesario.   Si se requiere una autorizacin previa para que su compaa de seguros cubra su medicamento, por favor permtanos de 1 a 2 das hbiles para completar este proceso.  Los precios de los medicamentos varan con frecuencia dependiendo del lugar de dnde se surte la receta y alguna farmacias pueden ofrecer  precios ms baratos.  El sitio web www.goodrx.com tiene cupones para medicamentos de diferentes farmacias. Los precios aqu no tienen en cuenta lo que podra costar con la ayuda del seguro (puede ser ms barato con su seguro), pero el sitio web puede darle el precio si no utiliz ningn seguro.  - Puede imprimir el cupn correspondiente y llevarlo con su receta a la farmacia.  - Tambin puede pasar   por nuestra oficina durante el horario de atencin regular y recoger una tarjeta de cupones de GoodRx.  - Si necesita que su receta se enve electrnicamente a una farmacia diferente, informe a nuestra oficina a travs de MyChart de [place] o por telfono llamando al [phone] y presione la opcin 4.  

## 2022-10-22 ENCOUNTER — Telehealth: Payer: Self-pay

## 2022-10-22 NOTE — Telephone Encounter (Signed)
Tried calling patient no answer, no vm./sh

## 2022-10-22 NOTE — Telephone Encounter (Signed)
-----   Message from Willeen Niece, MD sent at 10/22/2022  1:28 PM EDT ----- Skin , posterior neck BASAL CELL CARCINOMA, SUPERFICIAL AND NODULAR PATTERNS  BCC skin cancer- already treated with EDC at time of biopsy    - please call patient

## 2022-10-23 ENCOUNTER — Telehealth: Payer: Self-pay

## 2022-10-23 NOTE — Telephone Encounter (Signed)
-----   Message from Tara Stewart, MD sent at 10/22/2022  1:28 PM EDT ----- Skin , posterior neck BASAL CELL CARCINOMA, SUPERFICIAL AND NODULAR PATTERNS  BCC skin cancer- already treated with EDC at time of biopsy    - please call patient 

## 2022-10-23 NOTE — Telephone Encounter (Signed)
Advised pt of bx results/sh ?

## 2023-01-30 ENCOUNTER — Ambulatory Visit: Payer: No Typology Code available for payment source

## 2023-04-08 ENCOUNTER — Ambulatory Visit: Payer: No Typology Code available for payment source

## 2023-04-08 ENCOUNTER — Ambulatory Visit (INDEPENDENT_AMBULATORY_CARE_PROVIDER_SITE_OTHER): Payer: No Typology Code available for payment source | Admitting: Dermatology

## 2023-04-08 DIAGNOSIS — Z1283 Encounter for screening for malignant neoplasm of skin: Secondary | ICD-10-CM | POA: Diagnosis not present

## 2023-04-08 DIAGNOSIS — L820 Inflamed seborrheic keratosis: Secondary | ICD-10-CM | POA: Diagnosis not present

## 2023-04-08 DIAGNOSIS — L570 Actinic keratosis: Secondary | ICD-10-CM

## 2023-04-08 DIAGNOSIS — W908XXA Exposure to other nonionizing radiation, initial encounter: Secondary | ICD-10-CM

## 2023-04-08 DIAGNOSIS — L814 Other melanin hyperpigmentation: Secondary | ICD-10-CM

## 2023-04-08 DIAGNOSIS — L578 Other skin changes due to chronic exposure to nonionizing radiation: Secondary | ICD-10-CM | POA: Diagnosis not present

## 2023-04-08 DIAGNOSIS — Z7189 Other specified counseling: Secondary | ICD-10-CM

## 2023-04-08 DIAGNOSIS — D492 Neoplasm of unspecified behavior of bone, soft tissue, and skin: Secondary | ICD-10-CM

## 2023-04-08 DIAGNOSIS — D229 Melanocytic nevi, unspecified: Secondary | ICD-10-CM

## 2023-04-08 DIAGNOSIS — D225 Melanocytic nevi of trunk: Secondary | ICD-10-CM

## 2023-04-08 DIAGNOSIS — Z85820 Personal history of malignant melanoma of skin: Secondary | ICD-10-CM

## 2023-04-08 DIAGNOSIS — L821 Other seborrheic keratosis: Secondary | ICD-10-CM

## 2023-04-08 DIAGNOSIS — D485 Neoplasm of uncertain behavior of skin: Secondary | ICD-10-CM

## 2023-04-08 DIAGNOSIS — Z85828 Personal history of other malignant neoplasm of skin: Secondary | ICD-10-CM

## 2023-04-08 DIAGNOSIS — D1801 Hemangioma of skin and subcutaneous tissue: Secondary | ICD-10-CM

## 2023-04-08 HISTORY — DX: Actinic keratosis: L57.0

## 2023-04-08 NOTE — Patient Instructions (Addendum)
Cryotherapy Aftercare  Wash gently with soap and water everyday.   Apply Vaseline and Band-Aid daily until healed.    Wound Care Instructions  Cleanse wound gently with soap and water once a day then pat dry with clean gauze. Apply a thin coat of Petrolatum (petroleum jelly, "Vaseline") over the wound (unless you have an allergy to this). We recommend that you use a new, sterile tube of Vaseline. Do not pick or remove scabs. Do not remove the yellow or white "healing tissue" from the base of the wound.  Cover the wound with fresh, clean, nonstick gauze and secure with paper tape. You may use Band-Aids in place of gauze and tape if the wound is small enough, but would recommend trimming much of the tape off as there is often too much. Sometimes Band-Aids can irritate the skin.  You should call the office for your biopsy report after 1 week if you have not already been contacted.  If you experience any problems, such as abnormal amounts of bleeding, swelling, significant bruising, significant pain, or evidence of infection, please call the office immediately.  FOR ADULT SURGERY PATIENTS: If you need something for pain relief you may take 1 extra strength Tylenol (acetaminophen) AND 2 Ibuprofen (200mg  each) together every 4 hours as needed for pain. (do not take these if you are allergic to them or if you have a reason you should not take them.) Typically, you may only need pain medication for 1 to 3 days.    Melanoma ABCDEs  Melanoma is the most dangerous type of skin cancer, and is the leading cause of death from skin disease.  You are more likely to develop melanoma if you: Have light-colored skin, light-colored eyes, or red or blond hair Spend a lot of time in the sun Tan regularly, either outdoors or in a tanning bed Have had blistering sunburns, especially during childhood Have a close family member who has had a melanoma Have atypical moles or large birthmarks  Early detection of  melanoma is key since treatment is typically straightforward and cure rates are extremely high if we catch it early.   The first sign of melanoma is often a change in a mole or a new dark spot.  The ABCDE system is a way of remembering the signs of melanoma.  A for asymmetry:  The two halves do not match. B for border:  The edges of the growth are irregular. C for color:  A mixture of colors are present instead of an even brown color. D for diameter:  Melanomas are usually (but not always) greater than 6mm - the size of a pencil eraser. E for evolution:  The spot keeps changing in size, shape, and color.  Please check your skin once per month between visits. You can use a small mirror in front and a large mirror behind you to keep an eye on the back side or your body.   If you see any new or changing lesions before your next follow-up, please call to schedule a visit.  Please continue daily skin protection including broad spectrum sunscreen SPF 30+ to sun-exposed areas, reapplying every 2 hours as needed when you're outdoors.   Staying in the shade or wearing long sleeves, sun glasses (UVA+UVB protection) and wide brim hats (4-inch brim around the entire circumference of the hat) are also recommended for sun protection.    Due to recent changes in healthcare laws, you may see results of your pathology and/or laboratory studies  on MyChart before the doctors have had a chance to review them. We understand that in some cases there may be results that are confusing or concerning to you. Please understand that not all results are received at the same time and often the doctors may need to interpret multiple results in order to provide you with the best plan of care or course of treatment. Therefore, we ask that you please give [place] 2 business days to thoroughly review all your results before contacting the office for clarification. Should we see a critical lab result, you will be contacted sooner.   If  You Need Anything After Your Visit  If you have any questions or concerns for your doctor, please call our main line at [phone] and press option 4 to reach your doctor's medical assistant. If no one answers, please leave a voicemail as directed and we will return your call as soon as possible. Messages left after 4 pm will be answered the following business day.   You may also send [place] a message via MyChart. We typically respond to MyChart messages within 1-2 business days.  For prescription refills, please ask your pharmacy to contact our office. Our fax number is [phone].  If you have an urgent issue when the clinic is closed that cannot wait until the next business day, you can page your doctor at the number below.    Please note that while we do our best to be available for urgent issues outside of office hours, we are not available 24/7.   If you have an urgent issue and are unable to reach [place], you may choose to seek medical care at your doctor's office, retail clinic, urgent care center, or emergency room.  If you have a medical emergency, please immediately call 911 or go to the emergency department.  Pager Numbers  - Dr. Gwen Pounds: [phone]  - Dr. Roseanne Reno: [phone]  - Dr. Katrinka Blazing: [phone]   In the event of inclement weather, please call our main line at [phone] for an update on the status of any delays or closures.  Dermatology Medication Tips: Please keep the boxes that topical medications come in in order to help keep track of the instructions about where and how to use these. Pharmacies typically print the medication instructions only on the boxes and not directly on the medication tubes.   If your medication is too expensive, please contact our office at [phone] option 4 or send [place] a message through MyChart.   We are unable to tell what your co-pay for medications will be in advance as this is different depending on your insurance coverage.  However, we may be able to find a substitute medication at lower cost or fill out paperwork to get insurance to cover a needed medication.   If a prior authorization is required to get your medication covered by your insurance company, please allow [place] 1-2 business days to complete this process.  Drug prices often vary depending on where the prescription is filled and some pharmacies may offer cheaper prices.  The website www.goodrx.com contains coupons for medications through different pharmacies. The prices here do not account for what the cost may be with help from insurance (it may be cheaper with your insurance), but the website can give you the price if you did not use any insurance.  - You can print the associated coupon and take it with your prescription to the pharmacy.  - You may also stop by our office during  regular business hours and pick up a GoodRx coupon card.  - If you need your prescription sent electronically to a different pharmacy, notify our office through [facility] or by phone at [phone] option 4.     Si Usted Necesita Algo Despus de Su Visita  Tambin puede enviarnos un mensaje a travs de Clinical cytogeneticist. Por lo general respondemos a los mensajes de MyChart en el transcurso de 1 a 2 das hbiles.  Para renovar recetas, por favor pida a su farmacia que se ponga en contacto con nuestra oficina. Annie Sable de fax es [place] [phone].  Si tiene un asunto urgente cuando la clnica est cerrada y que no puede esperar hasta el siguiente da hbil, puede llamar/localizar a su doctor(a) al nmero que aparece a continuacin.   Por favor, tenga en cuenta que aunque hacemos todo lo posible para estar disponibles para asuntos urgentes fuera del horario de [place], no estamos disponibles las 24 horas del da, los 7 [address]  [address] de la [place].   Si tiene un problema urgente y no puede comunicarse con nosotros, puede optar por buscar atencin mdica  en el consultorio de su  doctor(a), en una clnica privada, en un centro de atencin urgente o en una sala de emergencias.  Si tiene Engineer, drilling, por favor llame inmediatamente al 911 o vaya a la sala de emergencias.  Nmeros de bper  - Dr. Gwen Pounds: [phone]  - Dra. Roseanne Reno: [phone]  - Dr. Katrinka Blazing: [phone]   En caso de inclemencias del tiempo, por favor llame a Lacy Duverney principal al [phone] para una actualizacin sobre el [place] de cualquier retraso o cierre.  Consejos para la medicacin en dermatologa: Por favor, guarde las cajas en las que vienen los medicamentos de uso tpico para ayudarle a seguir las instrucciones sobre dnde y cmo usarlos. Las farmacias generalmente imprimen las instrucciones del medicamento slo en las cajas y no directamente en los tubos del [place].   Si su medicamento es muy caro, por favor, pngase en contacto con Rolm Gala llamando al [phone] y presione la opcin 4 o envenos un mensaje a travs de Clinical cytogeneticist.   No podemos decirle cul ser su copago por los medicamentos por adelantado ya que esto es diferente dependiendo de la cobertura de su seguro. Sin embargo, es posible que podamos encontrar un medicamento sustituto a Audiological scientist un formulario para que el seguro cubra el medicamento que se considera necesario.   Si se requiere una autorizacin previa para que su compaa de seguros [place] su medicamento, por favor permtanos de 1 a 2 das hbiles para completar [address]  Los precios de los medicamentos varan con frecuencia dependiendo del Environmental consultant de dnde se surte la receta y alguna farmacias pueden ofrecer precios ms baratos.  El sitio web www.goodrx.com tiene cupones para medicamentos de Health and safety inspector. Los precios aqu no tienen en cuenta lo que podra costar con la ayuda del seguro (puede ser ms barato con su seguro), pero el sitio web puede darle el precio si no utiliz Tourist information centre manager.  - Puede imprimir el  cupn correspondiente y llevarlo con su receta a la farmacia.  - Tambin puede pasar por nuestra oficina durante el horario de atencin regular y Education officer, museum una tarjeta de cupones de GoodRx.  - Si necesita que su receta se enve electrnicamente a una farmacia diferente, informe a nuestra oficina a travs de MyChart de [place] o por telfono llamando al [phone] y presione la opcin 4.

## 2023-04-08 NOTE — Progress Notes (Signed)
Follow-Up Visit   Subjective  Cory Navarro is a 62 y.o. male who presents for the following: Skin Cancer Screening and Upper Body Skin Exam  The patient presents for Upper-Body Skin Exam (UBSE) for skin cancer screening and mole check. The patient has spots, moles and lesions to be evaluated, some may be new or changing. Some are sore and irritated.  He has a few spots to check/treat on the left temple and left preauricular, has used Carac Cream to these areas. History of multiple BCCs. History of melanoma of the left occipital hairline, 2006.  The following portions of the chart were reviewed this encounter and updated as appropriate: medications, allergies, medical history  Review of Systems:  No other skin or systemic complaints except as noted in HPI or Assessment and Plan.  Objective  Well appearing patient in no apparent distress; mood and affect are within normal limits.  A full examination was performed including scalp, head, eyes, ears, nose, lips, neck, chest, axillae, abdomen, back, buttocks, bilateral upper extremities, bilateral lower extremities, hands, feet, fingers, toes, fingernails, and toenails. All findings within normal limits unless otherwise noted below.   Relevant physical exam findings are noted in the Assessment and Plan.  Left Temple 0.4 cm violaceous scaly papule   scalp x 4, L sideburn x 5, L upper lat forehead x  2, R preauricular x 1, R ear helix x 2, R upper back x 1(vs early BCC), R mid back x 1 (vs Early BCC) (16) Pink scaly macules.  Right lower Posterior Neck x 1 Erythematous stuck-on, waxy papule or plaque    Assessment & Plan   SKIN CANCER SCREENING PERFORMED TODAY.  ACTINIC DAMAGE WITH PRECANCEROUS ACTINIC KERATOSES Counseling for Topical Chemotherapy Management: Patient exhibits: - Severe, confluent actinic changes with pre-cancerous actinic keratoses that is secondary to cumulative UV radiation exposure over time - Condition that is  severe; chronic, not at goal. - diffuse scaly erythematous macules and papules with underlying dyspigmentation - Discussed Prescription "Field Treatment" topical Chemotherapy for Severe, Chronic Confluent Actinic Changes with Pre-Cancerous Actinic Keratoses Field treatment involves treatment of an entire area of skin that has confluent Actinic Changes (Sun/ Ultraviolet light damage) and PreCancerous Actinic Keratoses by method of PhotoDynamic Therapy (PDT) and/or prescription Topical Chemotherapy agents such as 5-fluorouracil, 5-fluorouracil/calcipotriene, and/or imiquimod.  The purpose is to decrease the number of clinically evident and subclinical PreCancerous lesions to prevent progression to development of skin cancer by chemically destroying early precancer changes that may or may not be visible.  It has been shown to reduce the risk of developing skin cancer in the treated area. As a result of treatment, redness, scaling, crusting, and open sores may occur during treatment course. One or more than one of these methods may be used and may have to be used several times to control, suppress and eliminate the PreCancerous changes. Discussed treatment course, expected reaction, and possible side effects. - Recommend daily broad spectrum sunscreen SPF 30+ to sun-exposed areas, reapply every 2 hours as needed.  - Staying in the shade or wearing long sleeves, sun glasses (UVA+UVB protection) and wide brim hats (4-inch brim around the entire circumference of the hat) are also recommended. - Call for new or changing lesions. - Patient scheduled for Red light with debridement to the face next month.   LENTIGINES, SEBORRHEIC KERATOSES, HEMANGIOMAS - Benign normal skin lesions - Benign-appearing - Call for any changes  HEMANGIOMA Exam: purple papule left neck, present 30+ years per patient  with no changes.  Discussed benign nature. Recommend observation. Call for changes.  MELANOCYTIC NEVI - Tan-brown  and/or pink-flesh-colored symmetric macules and papules - 5 mm flesh papule left sternum  - Benign appearing on exam today - Observation - Call clinic for new or changing moles - Recommend daily use of broad spectrum spf 30+ sunscreen to sun-exposed areas.   History of Basal Cell Carcinoma of the Skin Multiple locations  - No evidence of recurrence today including posterior neck and L parietal scalp (most recent) - Recommend regular full body skin exams - Recommend daily broad spectrum sunscreen SPF 30+ to sun-exposed areas, reapply every 2 hours as needed.  - Call if any new or changing lesions are noted between office visits    History of Melanoma 2006 left occipital hairline - No evidence of recurrence today - Recommend regular full body skin exams - Recommend daily broad spectrum sunscreen SPF 30+ to sun-exposed areas, reapply every 2 hours as needed.  - Call if any new or changing lesions are noted between office visits  Neoplasm of uncertain behavior of skin Left Temple  Epidermal / dermal shaving  Lesion diameter (cm):  0.4 Informed consent: discussed and consent obtained   Patient was prepped and draped in usual sterile fashion: Area prepped with alcohol. Anesthesia: the lesion was anesthetized in a standard fashion   Anesthetic:  1% lidocaine w/ epinephrine 1-100,000 buffered w/ 8.4% NaHCO3 Instrument used: flexible razor blade   Hemostasis achieved with: pressure, aluminum chloride and electrodesiccation   Outcome: patient tolerated procedure well   Post-procedure details: wound care instructions given   Post-procedure details comment:  Ointment and small bandage applied  Anatomic Pathology Report  Biopsy sent to [facility] per patient request.   D/dx Hemangioma with ISK- irritating per pt  AK (actinic keratosis) (16) scalp x 4, L sideburn x 5, L upper lat forehead x  2, R preauricular x 1, R ear helix x 2, R upper back x 1(vs early BCC), R mid back x 1 (vs Early  BCC)  vs Early BCC - R upper back and R mid back. Recheck on follow-up.  Actinic keratoses are precancerous spots that appear secondary to cumulative UV radiation exposure/sun exposure over time. They are chronic with expected duration over 1 year. A portion of actinic keratoses will progress to squamous cell carcinoma of the skin. It is not possible to reliably predict which spots will progress to skin cancer and so treatment is recommended to prevent development of skin cancer.  Recommend daily broad spectrum sunscreen SPF 30+ to sun-exposed areas, reapply every 2 hours as needed.  Recommend staying in the shade or wearing long sleeves, sun glasses (UVA+UVB protection) and wide brim hats (4-inch brim around the entire circumference of the hat). Call for new or changing lesions.  Destruction of lesion - scalp x 4, L sideburn x 5, L upper lat forehead x  2, R preauricular x 1, R ear helix x 2, R upper back x 1(vs early BCC), R mid back x 1 (vs Early BCC) (16)  Destruction method: cryotherapy   Informed consent: discussed and consent obtained   Lesion destroyed using liquid nitrogen: Yes   Region frozen until ice ball extended beyond lesion: Yes   Outcome: patient tolerated procedure well with no complications   Post-procedure details: wound care instructions given   Additional details:  Prior to procedure, discussed risks of blister formation, small wound, skin dyspigmentation, or rare scar following cryotherapy. Recommend Vaseline ointment to treated  areas while healing.   Inflamed seborrheic keratosis Right lower Posterior Neck x 1  Symptomatic, irritating, patient would like treated.  Destruction of lesion - Right lower Posterior Neck x 1  Destruction method: cryotherapy   Informed consent: discussed and consent obtained   Lesion destroyed using liquid nitrogen: Yes   Region frozen until ice ball extended beyond lesion: Yes   Outcome: patient tolerated procedure well with no  complications   Post-procedure details: wound care instructions given   Additional details:  Prior to procedure, discussed risks of blister formation, small wound, skin dyspigmentation, or rare scar following cryotherapy. Recommend Vaseline ointment to treated areas while healing.   Actinic skin damage  Counseling and coordination of care  Lentigo  Seborrheic keratosis  Hemangioma of skin  Nevus  History of basal cell carcinoma (BCC) of skin  History of melanoma   Return in about 6 months (around 10/07/2023) for TBSE, Hx melanoma, Hx BCC.  ICherlyn Labella, CMA, am acting as scribe for Willeen Niece, MD .   Documentation: I have reviewed the above documentation for accuracy and completeness, and I agree with the above.  Willeen Niece, MD

## 2023-04-11 LAB — ANATOMIC PATHOLOGY REPORT

## 2023-04-15 ENCOUNTER — Telehealth: Payer: Self-pay

## 2023-04-15 ENCOUNTER — Ambulatory Visit: Payer: No Typology Code available for payment source

## 2023-04-15 NOTE — Telephone Encounter (Signed)
-----   Message from Willeen Niece sent at 04/15/2023  8:34 AM EST ----- Left Temple ,Skin Biopsy: ACTINIC KERATOSIS OVERLYING A HEMANGIOMA.   Precancer with benign hemangioma, if AK (scaly spot) recurs will treat with cryotherapy - please call patient

## 2023-04-15 NOTE — Telephone Encounter (Signed)
Advised pt of bx results/sh ?

## 2023-04-15 NOTE — Telephone Encounter (Signed)
Left message for patient to call for biopsy results. 

## 2023-05-29 ENCOUNTER — Ambulatory Visit (INDEPENDENT_AMBULATORY_CARE_PROVIDER_SITE_OTHER): Payer: No Typology Code available for payment source | Admitting: Dermatology

## 2023-05-29 DIAGNOSIS — L570 Actinic keratosis: Secondary | ICD-10-CM

## 2023-05-29 MED ORDER — AMINOLEVULINIC ACID HCL 10 % EX GEL
2000.0000 mg | Freq: Once | CUTANEOUS | Status: AC
  Administered 2023-05-29: 2000 mg via TOPICAL

## 2023-05-29 NOTE — Progress Notes (Signed)
Patient completed red light phototherapy with debridement today to face.  ACTINIC KERATOSES Exam: Erythematous thin papules/macules with gritty scale.  Treatment Plan:  Red Light Photodynamic therapy  Procedure discussed: discussed risks, benefits, side effects. and alternatives   Prep: site scrubbed/prepped with acetone   Debridement needed: Yes (performed by Physician with sand paper.  (CPT [id number])) Location:  face Number of lesions:  Multiple (> 15) Type of treatment:  Red light Aminolevulinic Acid (see MAR for details): Ameluz Aminolevulinic Acid comment:  [id number] Amount of Ameluz (mg):  1 Incubation time (minutes):  60 Number of minutes under lamp:  20 Cooling:  Fan Outcome: patient tolerated procedure well with no complications   Post-procedure details: sunscreen applied and aftercare instructions given to patient    Related Medications Aminolevulinic Acid HCl 10 % GEL 2,000 mg  [place] Desanctis  I personally debrided area prior to application of aminolevulinic acid, Willeen Niece MD  Documentation: I have reviewed the above documentation for accuracy and completeness, and I agree with the above.  Willeen Niece MD  [facility]-NURSE ROOM

## 2023-05-29 NOTE — Patient Instructions (Signed)

## 2023-09-24 ENCOUNTER — Ambulatory Visit: Payer: No Typology Code available for payment source | Admitting: Dermatology

## 2024-01-20 ENCOUNTER — Ambulatory Visit: Admitting: Dermatology

## 2024-01-23 ENCOUNTER — Ambulatory Visit: Admitting: Dermatology

## 2024-02-27 ENCOUNTER — Ambulatory Visit (INDEPENDENT_AMBULATORY_CARE_PROVIDER_SITE_OTHER): Payer: Self-pay | Admitting: Dermatology

## 2024-02-27 ENCOUNTER — Encounter: Payer: Self-pay | Admitting: Dermatology

## 2024-02-27 DIAGNOSIS — L578 Other skin changes due to chronic exposure to nonionizing radiation: Secondary | ICD-10-CM

## 2024-02-27 DIAGNOSIS — D229 Melanocytic nevi, unspecified: Secondary | ICD-10-CM

## 2024-02-27 DIAGNOSIS — Z85828 Personal history of other malignant neoplasm of skin: Secondary | ICD-10-CM

## 2024-02-27 DIAGNOSIS — Z85820 Personal history of malignant melanoma of skin: Secondary | ICD-10-CM

## 2024-02-27 DIAGNOSIS — L570 Actinic keratosis: Secondary | ICD-10-CM

## 2024-02-27 DIAGNOSIS — L821 Other seborrheic keratosis: Secondary | ICD-10-CM

## 2024-02-27 DIAGNOSIS — Z1283 Encounter for screening for malignant neoplasm of skin: Secondary | ICD-10-CM

## 2024-02-27 DIAGNOSIS — L814 Other melanin hyperpigmentation: Secondary | ICD-10-CM

## 2024-02-27 DIAGNOSIS — D1801 Hemangioma of skin and subcutaneous tissue: Secondary | ICD-10-CM

## 2024-02-27 NOTE — Patient Instructions (Signed)

## 2024-02-27 NOTE — Progress Notes (Signed)
 Follow-Up Visit   Subjective  Cory Navarro is a 63 y.o. male who presents for the following: Skin Cancer Screening and Upper Body Skin Exam  The patient presents for Upper Body Skin Exam (UBSE) for skin cancer screening and mole check. The patient has spots, moles and lesions to be evaluated, some may be new or changing and the patient may have concern these could be cancer.  Hx BCC, MM. Patient with some crusty spots at arms and face.   The following portions of the chart were reviewed this encounter and updated as appropriate: medications, allergies, medical history  Review of Systems:  No other skin or systemic complaints except as noted in HPI or Assessment and Plan.  Objective  Well appearing patient in no apparent distress; mood and affect are within normal limits.  All skin waist up examined. Relevant physical exam findings are noted in the Assessment and Plan.  L forearm/hand x 5 (5), ant vertex x 1, R lower cheek x 4, R postauricular x 1, R temple hairline x 1, nasal dorsum x 1, L preauricular x 3, L forehead/temple x 11, L mandible x 1, R forehead x 1 (24) Erythematous thin papules/macules with gritty scale.   Assessment & Plan   AK (ACTINIC KERATOSIS) (29) L forearm/hand x 5 (5), ant vertex x 1, R lower cheek x 4, R postauricular x 1, R temple hairline x 1, nasal dorsum x 1, L preauricular x 3, L forehead/temple x 11, L mandible x 1, R forehead x 1 (24) Actinic keratoses are precancerous spots that appear secondary to cumulative UV radiation exposure/sun exposure over time. They are chronic with expected duration over 1 year. A portion of actinic keratoses will progress to squamous cell carcinoma of the skin. It is not possible to reliably predict which spots will progress to skin cancer and so treatment is recommended to prevent development of skin cancer.  Recommend daily broad spectrum sunscreen SPF 30+ to sun-exposed areas, reapply every 2 hours as needed.  Recommend  staying in the shade or wearing long sleeves, sun glasses (UVA+UVB protection) and wide brim hats (4-inch brim around the entire circumference of the hat). Call for new or changing lesions.  Recheck left preauricular- 3rd treatment, left mandible on follow up- 2nd treatment.  Destruction of lesion - L forearm/hand x 5 (5), ant vertex x 1, R lower cheek x 4, R postauricular x 1, R temple hairline x 1, nasal dorsum x 1, L preauricular x 3, L forehead/temple x 11, L mandible x 1, R forehead x 1 (24)  Destruction method: cryotherapy   Informed consent: discussed and consent obtained   Lesion destroyed using liquid nitrogen: Yes   Region frozen until ice ball extended beyond lesion: Yes   Outcome: patient tolerated procedure well with no complications   Post-procedure details: wound care instructions given   Additional details:  Prior to procedure, discussed risks of blister formation, small wound, skin dyspigmentation, or rare scar following cryotherapy. Recommend Vaseline ointment to treated areas while healing.   Skin cancer screening performed today.  ACTINIC DAMAGE WITH PRECANCEROUS ACTINIC KERATOSES Counseling for Topical Chemotherapy Management: Patient exhibits: - Severe, confluent actinic changes with pre-cancerous actinic keratoses that is secondary to cumulative UV radiation exposure over time - Condition that is severe; chronic, not at goal. - diffuse scaly erythematous macules and papules with underlying dyspigmentation - Discussed Prescription Field Treatment topical Chemotherapy for Severe, Chronic Confluent Actinic Changes with Pre-Cancerous Actinic Keratoses Field treatment involves treatment  of an entire area of skin that has confluent Actinic Changes (Sun/ Ultraviolet light damage) and PreCancerous Actinic Keratoses by method of PhotoDynamic Therapy (PDT) and/or prescription Topical Chemotherapy agents such as 5-fluorouracil, 5-fluorouracil/calcipotriene, and/or imiquimod.  The  purpose is to decrease the number of clinically evident and subclinical PreCancerous lesions to prevent progression to development of skin cancer by chemically destroying early precancer changes that may or may not be visible.  It has been shown to reduce the risk of developing skin cancer in the treated area. As a result of treatment, redness, scaling, crusting, and open sores may occur during treatment course. One or more than one of these methods may be used and may have to be used several times to control, suppress and eliminate the PreCancerous changes. Discussed treatment course, expected reaction, and possible side effects. - Recommend daily broad spectrum sunscreen SPF 30+ to sun-exposed areas, reapply every 2 hours as needed.  - Staying in the shade or wearing long sleeves, sun glasses (UVA+UVB protection) and wide brim hats (4-inch brim around the entire circumference of the hat) are also recommended. - Call for new or changing lesions. - Recommend red light PDT with debridement to face. - Recommend blue light photodynamic therapy with debridement to the arms   - Patient will schedule.    Lentigines, Seborrheic Keratoses, Hemangiomas - Benign normal skin lesions - Benign-appearing - Call for any changes  Melanocytic Nevi - Tan-brown and/or pink-flesh-colored symmetric macules and papules - Benign appearing on exam today - Observation - Call clinic for new or changing moles - Recommend daily use of broad spectrum spf 30+ sunscreen to sun-exposed areas.   History of Basal Cell Carcinoma of the Skin Multiple locations  - No evidence of recurrence today including posterior neck and L parietal scalp (most recent) - Recommend regular full body skin exams - Recommend daily broad spectrum sunscreen SPF 30+ to sun-exposed areas, reapply every 2 hours as needed.  - Call if any new or changing lesions are noted between office visits    History of Melanoma 2006 left occipital hairline - No  evidence of recurrence today - Recommend regular full body skin exams - Recommend daily broad spectrum sunscreen SPF 30+ to sun-exposed areas, reapply every 2 hours as needed.  - Call if any new or changing lesions are noted between office visits  INFLAMMATORY PAPULE Exam: pink papule at right posterior lower neck  Treatment Plan: Benign-appearing.  Observation.  Call clinic for new or changing lesions.  Recommend daily use of broad spectrum spf 30+ sunscreen to sun-exposed areas.   Recheck on follow up.   Return in about 6 months (around 08/27/2024) for recheck left preauricular, left mandible with Dr. Jackquline, UBSE.  Cory Navarro, RMA, am acting as scribe for Rexene Jackquline, MD .   Documentation: I have reviewed the above documentation for accuracy and completeness, and I agree with the above.  Rexene Jackquline, MD

## 2024-03-31 ENCOUNTER — Ambulatory Visit: Payer: Self-pay | Admitting: Dermatology

## 2024-03-31 DIAGNOSIS — L570 Actinic keratosis: Secondary | ICD-10-CM

## 2024-03-31 MED ORDER — AMINOLEVULINIC ACID HCL 10 % EX GEL
2000.0000 mg | Freq: Once | CUTANEOUS | Status: AC
  Administered 2024-03-31: 2000 mg via TOPICAL

## 2024-03-31 NOTE — Progress Notes (Signed)
 Patient completed red light phototherapy with debridement today.  ACTINIC KERATOSES Exam: Erythematous thin papules/macules with gritty scale.  Treatment Plan:  Red Light Photodynamic therapy  Procedure discussed: discussed risks, benefits, side effects. and alternatives   Prep: site scrubbed/prepped with acetone   Debridement needed: Yes (performed by Physician with sand paper.  (CPT [id number])) Location:  face Number of lesions:  Multiple (> 15) Type of treatment:  Red light Aminolevulinic Acid (see MAR for details): Ameluz  Aminolevulinic Acid comment:  [id number] Amount of Ameluz  (mg):  1 Incubation time (minutes):  60 Number of minutes under lamp:  20 Cooling:  Fan Outcome: patient tolerated procedure well with no complications   Post-procedure details: sunscreen applied and aftercare instructions given to patient    Related Medications Aminolevulinic Acid HCl 10 % GEL 2,000 mg  Amanda White, RMA  I personally debrided area prior to application of aminolevulinic acid. Rexene Rattler MD  Documentation: I have reviewed the above documentation for accuracy and completeness, and I agree with the above.  Rexene Rattler MD

## 2024-03-31 NOTE — Patient Instructions (Signed)

## 2024-04-07 ENCOUNTER — Ambulatory Visit

## 2024-04-07 ENCOUNTER — Encounter

## 2024-04-16 ENCOUNTER — Ambulatory Visit

## 2024-04-16 ENCOUNTER — Encounter

## 2024-04-27 ENCOUNTER — Ambulatory Visit: Payer: Self-pay | Admitting: Dermatology

## 2024-04-27 DIAGNOSIS — L570 Actinic keratosis: Secondary | ICD-10-CM

## 2024-04-27 MED ORDER — AMINOLEVULINIC ACID HCL 20 % EX SOLR
2.0000 | Freq: Once | CUTANEOUS | Status: AC
  Administered 2024-04-27: 708 mg via TOPICAL

## 2024-04-27 NOTE — Progress Notes (Signed)
 Patient completed blue light phototherapy with debridement today to arms and hands.   ACTINIC KERATOSES Exam: Erythematous thin papules/macules with gritty scale.   Treatment Plan:  Blue Light Photodynamic therapy  Procedure discussed: discussed risks, benefits, side effects. and alternatives   Prep: site scrubbed/prepped with acetone   Debridement needed: Yes (performed by Physician with sand paper.  (CPT [id number])) Location:  arms and hands Number of lesions:  Multiple (> 15) Type of treatment:  Blue light Aminolevulinic Acid (see MAR for details): Levulan  Aminolevulinic Acid comment:  [id number] Amount of Levulan  (mg):  1 Incubation time (minutes):  120 Number of minutes under lamp:  16:40 Cooling:  Fan Outcome: patient tolerated procedure well with no complications   Post-procedure details: sunscreen applied and aftercare instructions given to patient       Alan Pizza, RMA   I personally debrided area prior to application of aminolevulinic acid, Rexene Rattler MD.   Documentation: I have reviewed the above documentation for accuracy and completeness, and I agree with the above.   Rexene Rattler MD

## 2024-04-27 NOTE — Patient Instructions (Signed)
Levulan/PDT Treatment Common Side Effects  - Burning/stinging, which may be severe and last up to 24-72 hours after your treatment  - Redness, swelling and/or peeling which may last up to 4 weeks  - Scaling/crusting which may last up to 2 weeks  - Sun sensitivity (you MUST avoid sun exposure for 48-72 hours after treatment)  Care Instructions  - Okay to wash with soap and water and shampoo as normal  - If needed, you can do a cold compress (ex. Ice packs) for comfort  - If okay with your Primary Doctor, you may use analgesics such as Tylenol every 4-6 hours, not to exceed recommended dose  - You may apply Cerave Healing Ointment, Vaseline or Aquaphor  - If you have a lot of swelling you may take a Benadryl to help with this (this may cause drowsiness)  Sun Precautions  - Wear a wide brim hat for the next week if outside  - Wear a sunblock with zinc or titanium dioxide at least SPF 50 daily   We will recheck you in 10-12 weeks. If any problems, please call the office and ask to speak with a nurse. 

## 2024-08-17 ENCOUNTER — Ambulatory Visit: Admitting: Dermatology
